# Patient Record
Sex: Male | Born: 1940 | Race: Black or African American | Hispanic: No | Marital: Married | Smoking: Former smoker
Health system: Southern US, Community
[De-identification: ages and names within clinical notes are randomized; demographics above are authoritative.]

## PROBLEM LIST (undated history)

## (undated) DIAGNOSIS — G4733 Obstructive sleep apnea (adult) (pediatric): Secondary | ICD-10-CM

## (undated) DIAGNOSIS — I10 Essential (primary) hypertension: Secondary | ICD-10-CM

## (undated) DIAGNOSIS — D509 Iron deficiency anemia, unspecified: Secondary | ICD-10-CM

## (undated) DIAGNOSIS — J9610 Chronic respiratory failure, unspecified whether with hypoxia or hypercapnia: Secondary | ICD-10-CM

## (undated) DIAGNOSIS — K219 Gastro-esophageal reflux disease without esophagitis: Secondary | ICD-10-CM

## (undated) DIAGNOSIS — E785 Hyperlipidemia, unspecified: Secondary | ICD-10-CM

## (undated) DIAGNOSIS — N183 Chronic kidney disease, stage 3 unspecified: Secondary | ICD-10-CM

## (undated) DIAGNOSIS — I2510 Atherosclerotic heart disease of native coronary artery without angina pectoris: Secondary | ICD-10-CM

## (undated) DIAGNOSIS — J449 Chronic obstructive pulmonary disease, unspecified: Secondary | ICD-10-CM

## (undated) DIAGNOSIS — I509 Heart failure, unspecified: Secondary | ICD-10-CM

## (undated) DIAGNOSIS — E66813 Obesity, class 3: Secondary | ICD-10-CM

## (undated) DIAGNOSIS — M1990 Unspecified osteoarthritis, unspecified site: Secondary | ICD-10-CM

## (undated) DIAGNOSIS — N400 Enlarged prostate without lower urinary tract symptoms: Secondary | ICD-10-CM

## (undated) HISTORY — PX: BACK SURGERY: SHX140

## (undated) HISTORY — PX: CHOLECYSTECTOMY: SHX55

## (undated) HISTORY — PX: TONSILLECTOMY: SUR1361

## (undated) HISTORY — PX: CORONARY ARTERY BYPASS GRAFT: SHX141

## (undated) HISTORY — PX: CATARACT EXTRACTION: SUR2

---

## 2001-01-06 ENCOUNTER — Ambulatory Visit (HOSPITAL_COMMUNITY): Admission: RE | Admit: 2001-01-06 | Discharge: 2001-01-06 | Payer: Self-pay | Admitting: Internal Medicine

## 2001-08-04 ENCOUNTER — Ambulatory Visit (HOSPITAL_COMMUNITY): Admission: RE | Admit: 2001-08-04 | Discharge: 2001-08-04 | Payer: Self-pay | Admitting: Internal Medicine

## 2001-08-17 ENCOUNTER — Encounter: Admission: RE | Admit: 2001-08-17 | Discharge: 2001-08-17 | Payer: Self-pay | Admitting: Internal Medicine

## 2001-08-17 ENCOUNTER — Encounter: Payer: Self-pay | Admitting: Internal Medicine

## 2001-09-14 ENCOUNTER — Encounter: Payer: Self-pay | Admitting: Internal Medicine

## 2001-09-14 ENCOUNTER — Ambulatory Visit (HOSPITAL_COMMUNITY): Admission: RE | Admit: 2001-09-14 | Discharge: 2001-09-14 | Payer: Self-pay | Admitting: Internal Medicine

## 2001-11-24 ENCOUNTER — Inpatient Hospital Stay (HOSPITAL_COMMUNITY): Admission: EM | Admit: 2001-11-24 | Discharge: 2001-12-10 | Payer: Self-pay

## 2001-11-25 ENCOUNTER — Encounter: Payer: Self-pay | Admitting: Interventional Cardiology

## 2001-11-27 ENCOUNTER — Encounter: Payer: Self-pay | Admitting: Interventional Cardiology

## 2001-12-01 ENCOUNTER — Encounter: Payer: Self-pay | Admitting: Thoracic Surgery (Cardiothoracic Vascular Surgery)

## 2001-12-02 ENCOUNTER — Encounter: Payer: Self-pay | Admitting: Thoracic Surgery (Cardiothoracic Vascular Surgery)

## 2001-12-03 ENCOUNTER — Encounter: Payer: Self-pay | Admitting: Thoracic Surgery (Cardiothoracic Vascular Surgery)

## 2001-12-06 ENCOUNTER — Encounter: Payer: Self-pay | Admitting: Cardiothoracic Surgery

## 2001-12-07 ENCOUNTER — Encounter: Payer: Self-pay | Admitting: Thoracic Surgery (Cardiothoracic Vascular Surgery)

## 2002-03-01 ENCOUNTER — Encounter (HOSPITAL_COMMUNITY): Admission: RE | Admit: 2002-03-01 | Discharge: 2002-05-30 | Payer: Self-pay | Admitting: Interventional Cardiology

## 2002-03-21 ENCOUNTER — Encounter: Admission: RE | Admit: 2002-03-21 | Discharge: 2002-03-21 | Payer: Self-pay | Admitting: Interventional Cardiology

## 2002-03-21 ENCOUNTER — Encounter: Payer: Self-pay | Admitting: Interventional Cardiology

## 2002-06-08 ENCOUNTER — Encounter: Admission: RE | Admit: 2002-06-08 | Discharge: 2002-06-08 | Payer: Self-pay | Admitting: Internal Medicine

## 2002-06-08 ENCOUNTER — Encounter: Payer: Self-pay | Admitting: Internal Medicine

## 2002-06-23 ENCOUNTER — Encounter: Admission: RE | Admit: 2002-06-23 | Discharge: 2002-09-21 | Payer: Self-pay | Admitting: Internal Medicine

## 2002-11-04 ENCOUNTER — Ambulatory Visit (HOSPITAL_COMMUNITY): Admission: RE | Admit: 2002-11-04 | Discharge: 2002-11-04 | Payer: Self-pay | Admitting: Gastroenterology

## 2002-12-13 ENCOUNTER — Emergency Department (HOSPITAL_COMMUNITY): Admission: EM | Admit: 2002-12-13 | Discharge: 2002-12-13 | Payer: Self-pay | Admitting: Emergency Medicine

## 2002-12-13 ENCOUNTER — Encounter: Payer: Self-pay | Admitting: Emergency Medicine

## 2003-07-18 ENCOUNTER — Encounter: Admission: RE | Admit: 2003-07-18 | Discharge: 2003-07-18 | Payer: Self-pay | Admitting: Internal Medicine

## 2003-07-18 ENCOUNTER — Encounter: Payer: Self-pay | Admitting: Internal Medicine

## 2004-06-11 ENCOUNTER — Encounter: Admission: RE | Admit: 2004-06-11 | Discharge: 2004-06-11 | Payer: Self-pay | Admitting: Internal Medicine

## 2004-06-17 ENCOUNTER — Inpatient Hospital Stay (HOSPITAL_COMMUNITY): Admission: EM | Admit: 2004-06-17 | Discharge: 2004-06-19 | Payer: Self-pay | Admitting: Internal Medicine

## 2004-11-29 ENCOUNTER — Encounter: Admission: RE | Admit: 2004-11-29 | Discharge: 2004-11-29 | Payer: Self-pay | Admitting: Internal Medicine

## 2004-12-12 ENCOUNTER — Encounter: Admission: RE | Admit: 2004-12-12 | Discharge: 2004-12-12 | Payer: Self-pay | Admitting: Internal Medicine

## 2004-12-31 ENCOUNTER — Encounter: Admission: RE | Admit: 2004-12-31 | Discharge: 2004-12-31 | Payer: Self-pay | Admitting: Internal Medicine

## 2005-01-14 ENCOUNTER — Encounter: Admission: RE | Admit: 2005-01-14 | Discharge: 2005-01-14 | Payer: Self-pay | Admitting: Internal Medicine

## 2005-10-18 ENCOUNTER — Inpatient Hospital Stay (HOSPITAL_COMMUNITY): Admission: EM | Admit: 2005-10-18 | Discharge: 2005-10-20 | Payer: Self-pay | Admitting: *Deleted

## 2006-02-05 ENCOUNTER — Encounter: Admission: RE | Admit: 2006-02-05 | Discharge: 2006-02-05 | Payer: Self-pay | Admitting: Internal Medicine

## 2006-02-13 ENCOUNTER — Inpatient Hospital Stay (HOSPITAL_COMMUNITY): Admission: EM | Admit: 2006-02-13 | Discharge: 2006-02-18 | Payer: Self-pay | Admitting: Emergency Medicine

## 2006-02-16 ENCOUNTER — Encounter (INDEPENDENT_AMBULATORY_CARE_PROVIDER_SITE_OTHER): Payer: Self-pay | Admitting: Specialist

## 2006-10-13 ENCOUNTER — Inpatient Hospital Stay (HOSPITAL_COMMUNITY): Admission: EM | Admit: 2006-10-13 | Discharge: 2006-10-14 | Payer: Self-pay | Admitting: Emergency Medicine

## 2007-01-12 ENCOUNTER — Encounter: Admission: RE | Admit: 2007-01-12 | Discharge: 2007-01-12 | Payer: Self-pay | Admitting: Internal Medicine

## 2007-06-01 ENCOUNTER — Encounter: Admission: RE | Admit: 2007-06-01 | Discharge: 2007-06-01 | Payer: Self-pay | Admitting: Podiatry

## 2007-10-25 ENCOUNTER — Inpatient Hospital Stay (HOSPITAL_COMMUNITY): Admission: EM | Admit: 2007-10-25 | Discharge: 2007-10-27 | Payer: Self-pay | Admitting: Emergency Medicine

## 2007-10-25 ENCOUNTER — Ambulatory Visit: Payer: Self-pay | Admitting: Internal Medicine

## 2007-11-23 ENCOUNTER — Encounter: Admission: RE | Admit: 2007-11-23 | Discharge: 2008-02-21 | Payer: Self-pay | Admitting: Internal Medicine

## 2008-01-31 ENCOUNTER — Emergency Department (HOSPITAL_COMMUNITY): Admission: EM | Admit: 2008-01-31 | Discharge: 2008-01-31 | Payer: Self-pay | Admitting: Emergency Medicine

## 2008-11-10 ENCOUNTER — Encounter: Admission: RE | Admit: 2008-11-10 | Discharge: 2008-11-10 | Payer: Self-pay | Admitting: Internal Medicine

## 2009-05-22 ENCOUNTER — Inpatient Hospital Stay (HOSPITAL_COMMUNITY): Admission: EM | Admit: 2009-05-22 | Discharge: 2009-05-24 | Payer: Self-pay | Admitting: Emergency Medicine

## 2009-05-23 ENCOUNTER — Ambulatory Visit: Payer: Self-pay | Admitting: Physical Medicine & Rehabilitation

## 2009-07-27 ENCOUNTER — Encounter (INDEPENDENT_AMBULATORY_CARE_PROVIDER_SITE_OTHER): Payer: Self-pay | Admitting: Internal Medicine

## 2009-07-27 ENCOUNTER — Ambulatory Visit: Payer: Self-pay | Admitting: Cardiovascular Disease

## 2009-07-27 ENCOUNTER — Observation Stay (HOSPITAL_COMMUNITY): Admission: EM | Admit: 2009-07-27 | Discharge: 2009-07-27 | Payer: Self-pay | Admitting: Emergency Medicine

## 2010-04-22 ENCOUNTER — Inpatient Hospital Stay (HOSPITAL_COMMUNITY): Admission: EM | Admit: 2010-04-22 | Discharge: 2010-04-26 | Payer: Self-pay | Admitting: Emergency Medicine

## 2010-10-20 ENCOUNTER — Encounter: Payer: Self-pay | Admitting: Internal Medicine

## 2010-10-29 ENCOUNTER — Inpatient Hospital Stay (HOSPITAL_COMMUNITY)
Admission: EM | Admit: 2010-10-29 | Discharge: 2010-11-01 | DRG: 641 | Disposition: A | Discharge status: Skilled Nursing Facility | Payer: Medicare Other | Attending: Internal Medicine | Admitting: Internal Medicine

## 2010-10-29 DIAGNOSIS — R627 Adult failure to thrive: Principal | ICD-10-CM | POA: Diagnosis present

## 2010-10-29 DIAGNOSIS — E1139 Type 2 diabetes mellitus with other diabetic ophthalmic complication: Secondary | ICD-10-CM | POA: Diagnosis present

## 2010-10-29 DIAGNOSIS — E1149 Type 2 diabetes mellitus with other diabetic neurological complication: Secondary | ICD-10-CM | POA: Diagnosis present

## 2010-10-29 DIAGNOSIS — E1129 Type 2 diabetes mellitus with other diabetic kidney complication: Secondary | ICD-10-CM | POA: Diagnosis present

## 2010-10-29 DIAGNOSIS — Z9181 History of falling: Secondary | ICD-10-CM

## 2010-10-29 DIAGNOSIS — I739 Peripheral vascular disease, unspecified: Secondary | ICD-10-CM | POA: Diagnosis present

## 2010-10-29 DIAGNOSIS — R269 Unspecified abnormalities of gait and mobility: Secondary | ICD-10-CM | POA: Diagnosis present

## 2010-10-29 DIAGNOSIS — M1990 Unspecified osteoarthritis, unspecified site: Secondary | ICD-10-CM | POA: Diagnosis present

## 2010-10-29 DIAGNOSIS — K219 Gastro-esophageal reflux disease without esophagitis: Secondary | ICD-10-CM | POA: Diagnosis present

## 2010-10-29 DIAGNOSIS — R5381 Other malaise: Secondary | ICD-10-CM | POA: Diagnosis present

## 2010-10-29 DIAGNOSIS — G4733 Obstructive sleep apnea (adult) (pediatric): Secondary | ICD-10-CM | POA: Diagnosis present

## 2010-10-29 DIAGNOSIS — N183 Chronic kidney disease, stage 3 unspecified: Secondary | ICD-10-CM | POA: Diagnosis present

## 2010-10-29 DIAGNOSIS — Z951 Presence of aortocoronary bypass graft: Secondary | ICD-10-CM

## 2010-10-29 DIAGNOSIS — K3184 Gastroparesis: Secondary | ICD-10-CM | POA: Diagnosis present

## 2010-10-29 DIAGNOSIS — K5909 Other constipation: Secondary | ICD-10-CM | POA: Diagnosis present

## 2010-10-29 DIAGNOSIS — G894 Chronic pain syndrome: Secondary | ICD-10-CM | POA: Diagnosis present

## 2010-10-29 DIAGNOSIS — I129 Hypertensive chronic kidney disease with stage 1 through stage 4 chronic kidney disease, or unspecified chronic kidney disease: Secondary | ICD-10-CM | POA: Diagnosis present

## 2010-10-29 DIAGNOSIS — E1142 Type 2 diabetes mellitus with diabetic polyneuropathy: Secondary | ICD-10-CM | POA: Diagnosis present

## 2010-10-29 DIAGNOSIS — E11319 Type 2 diabetes mellitus with unspecified diabetic retinopathy without macular edema: Secondary | ICD-10-CM | POA: Diagnosis present

## 2010-10-29 DIAGNOSIS — I2510 Atherosclerotic heart disease of native coronary artery without angina pectoris: Secondary | ICD-10-CM | POA: Diagnosis present

## 2010-10-29 DIAGNOSIS — Z9981 Dependence on supplemental oxygen: Secondary | ICD-10-CM

## 2010-10-30 ENCOUNTER — Emergency Department (HOSPITAL_COMMUNITY): Payer: Medicare Other

## 2010-10-30 LAB — HEPATIC FUNCTION PANEL
Alkaline Phosphatase: 72 U/L (ref 39–117)
Bilirubin, Direct: 0.4 mg/dL — ABNORMAL HIGH (ref 0.0–0.3)
Indirect Bilirubin: 0.5 mg/dL (ref 0.3–0.9)
Total Bilirubin: 0.9 mg/dL (ref 0.3–1.2)
Total Protein: 6.6 g/dL (ref 6.0–8.3)

## 2010-10-30 LAB — GLUCOSE, CAPILLARY
Glucose-Capillary: 171 mg/dL — ABNORMAL HIGH (ref 70–99)
Glucose-Capillary: 144 mg/dL — ABNORMAL HIGH (ref 70–99)

## 2010-10-30 LAB — CBC
HCT: 37.3 % — ABNORMAL LOW (ref 39.0–52.0)
Hemoglobin: 11.8 g/dL — ABNORMAL LOW (ref 13.0–17.0)
MCH: 23 pg — ABNORMAL LOW (ref 26.0–34.0)
MCV: 72.7 fL — ABNORMAL LOW (ref 78.0–100.0)
Platelets: 168 10*3/uL (ref 150–400)
RDW: 17.3 % — ABNORMAL HIGH (ref 11.5–15.5)
WBC: 6.8 10*3/uL (ref 4.0–10.5)

## 2010-10-30 LAB — CARDIAC PANEL(CRET KIN+CKTOT+MB+TROPI)
CK, MB: 4.9 ng/mL — ABNORMAL HIGH (ref 0.3–4.0)
Relative Index: 2.1 (ref 0.0–2.5)
Troponin I: 0.02 ng/mL (ref 0.00–0.06)

## 2010-10-30 LAB — TSH: TSH: 1.419 u[IU]/mL (ref 0.350–4.500)

## 2010-10-30 LAB — DIFFERENTIAL
Basophils Absolute: 0 10*3/uL (ref 0.0–0.1)
Eosinophils Absolute: 0.1 10*3/uL (ref 0.0–0.7)
Eosinophils Relative: 1 % (ref 0–5)
Lymphocytes Relative: 23 % (ref 12–46)
Monocytes Absolute: 0.7 10*3/uL (ref 0.1–1.0)
Monocytes Relative: 11 % (ref 3–12)

## 2010-10-31 LAB — CBC
HCT: 35.2 % — ABNORMAL LOW (ref 39.0–52.0)
MCH: 23 pg — ABNORMAL LOW (ref 26.0–34.0)
MCV: 72.4 fL — ABNORMAL LOW (ref 78.0–100.0)
RBC: 4.86 MIL/uL (ref 4.22–5.81)

## 2010-10-31 LAB — GLUCOSE, CAPILLARY
Glucose-Capillary: 125 mg/dL — ABNORMAL HIGH (ref 70–99)
Glucose-Capillary: 140 mg/dL — ABNORMAL HIGH (ref 70–99)

## 2010-10-31 LAB — COMPREHENSIVE METABOLIC PANEL
ALT: 14 U/L (ref 0–53)
Albumin: 2.7 g/dL — ABNORMAL LOW (ref 3.5–5.2)
Chloride: 106 mEq/L (ref 96–112)
GFR calc Af Amer: 42 mL/min — ABNORMAL LOW (ref >60)
GFR calc non Af Amer: 34 mL/min — ABNORMAL LOW (ref >60)
Glucose, Bld: 120 mg/dL — ABNORMAL HIGH (ref 70–99)
Sodium: 139 mEq/L (ref 135–145)
Total Protein: 6.1 g/dL (ref 6.0–8.3)

## 2010-11-01 LAB — GLUCOSE, CAPILLARY: Glucose-Capillary: 98 mg/dL (ref 70–99)

## 2010-11-03 NOTE — Discharge Summary (Signed)
[name], HICKOX NO.:  [id number]  MEDICAL RECORD NO.:  [id number]           PATIENT TYPE:  I  LOCATION:  1511                         FACILITY:  [facility]  PHYSICIAN:  Georgann Housekeeper, MD      DATE OF BIRTH:  Mar 31, 1941  DATE OF ADMISSION:  10/29/2010 DATE OF DISCHARGE:  11/01/2010                              DISCHARGE SUMMARY   ALLERGIES:  To DURAGESIC PATCH.  MEDICATIONS ON DISCHARGE:  Benazepril 20 mg p.o. b.i.d., gabapentin 600 mg 1 capsule 4 times a day, Amaryl 2 mg 1 tablet at noontime, Humalog sliding scale per hospital with meals, isosorbide mononitrate 60 mg p.o. daily, omeprazole 40 mg one daily, oxycodone/acetaminophen 10/325 one tablet q.6-8 h. p.r.n. for pain, Flomax 0.4 mg 1 capsule p.m., simvastatin 20 mg 1 capsule at bedtime, temazepam 30 mg q.h.s., Lantus 50 units b.i.d., MiraLax 1 packet p.o. daily, Colace 100 mg b.i.d., Senokot 1 tablet q.h.s., Metanx 1 tablet b.i.d. on the condom catheter, oxygen 2 L nasal cannula on the benazepril parameters hold for blood pressure less than 110,  Reglan 5 mg q.a.c.  As far as his as discharge diagnoses; 1. Failure to thrive. 2. Deconditioning. 3. Gait disorder. 4. Frequent falls. 5. Chronic neuropathy. 6. Chronic back pain and chronic pain syndrome. 7. Constipation, chronic, resolved. 8. Gastroparesis. 9. 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 10.Hypertension. 11.History of coronary artery disease, coronary artery bypass graft. 12.Peripheral vascular disease. 13.Chronic kidney disease stage III. 14.Severe osteoarthritis. 15.Gastroesophageal reflux disease. 16.Obstructive sleep apnea, chronic oxygen use.  As far as his laboratory data; his UA was negative.  CBC showed hemoglobin of 11.2, white count of 7.7.  Creatinine of 1.9, potassium 4.6, sodium 139.  LFTs were normal.  Cardiac markers negative.  Chest x- ray and abdominal x-ray negative.  HOSPITAL COURSE:  A   70 year old male admitted with failure to thrive, deconditioning, unable to be taken care at home, constipation, abdominal discomfort with chronic pain. 1. Failure to thrive and deconditioning with gait disorder.  The     patient has physical therapy consult, recommended SNF placement.     The patient because of his weight and morbid obesity, unable to be     cared by his wife who was elderly and has stroke. The patient had     been using the walker but even with that, he was been falling and     unable to get up on his own, chronic pain syndrome. 2. Constipation.  The patient was treated with enemas and his bowel     regimen was adjusted.  He had good bowel movements.  Continue on     MiraLax, Colace, and Senokot. 3. Chronic pain syndrome.  He has been on oxycodone at home.  Will try     to get on Duragesic patch but he had in the past intolerant, even     retrying of the patch at lower dose caused him vomiting which was     discontinued. As far as his history of diabetes with multiple     complications and gastroparesis, continue on Reglan.  4. Hypertension and coronary artery disease, stable chronic kidney     disease stage III, creatinine 1.9, stable. 5. Gastroesophageal reflux disease.  Continue on PPI.  Chronic     neuropathy, on Neurontin. Metanx was added for the neuropathic     pain.  The patient will be going to the SNF rehab care today,     medically stable to go.  Discharge time taken greater than 30 minutes.     Georgann Housekeeper, MD     KH/MEDQ  D:  11/01/2010  T:  11/01/2010  Job:  [id number]  Electronically Signed by Georgann Housekeeper MD on 11/03/2010 10:29:11 PM

## 2010-11-20 NOTE — H&P (Signed)
Donald [name], [name] NO.:  [id number]  MEDICAL RECORD NO.:  [id number]          PATIENT TYPE:  EMS  LOCATION:  ED                           FACILITY:  [facility]  PHYSICIAN:  Massie Maroon, MD        DATE OF BIRTH:  03-04-1941  DATE OF ADMISSION:  10/29/2010 DATE OF DISCHARGE:                             HISTORY & PHYSICAL   CHIEF COMPLAINT:  " I am constipated and I am having failure to thrive."  HISTORY OF PRESENT ILLNESS:  The patient is a 70 year old male with a history of CAD status post CABG, diabetes, diabetic gastroparesis, diabetic neuropathy, PVD, apparently complains of generalized myalgia, back pain, arm pain, shoulder pain.  His wife states that she cannot take care of him at home.  Essentially, the wife wants nursing home placement for her husband as she can no longer take care of him.  PAST MEDICAL HISTORY: 1. Hypertension. 2. Hyperlipidemia. 3. CAD status post CABG. 4. Diabetes type 2. 5. Diabetic gastroparesis. 6. Diabetic neuropathy. 7. PVD. 8. Chronic pain syndrome from multiple back surgeries. 9. Chronic kidney disease, stage 3. 10.GERD. 11.Osteoarthritis. 12.History of constipation. 13.Obstructive sleep apnea, on 3 L nasal cannula at night. 14.CT scan of the abdomen and pelvis on Feb 12, 2006 showed poor     definition of fat plane surrounding the head and uncinate process     of pancreas raising concern for focal pancreatitis or even a     pancreatic mass.  Consider MR imaging.  SOCIAL HISTORY:  The patient is living at home.  Married.  Six children. The patient does not drink.  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  FAMILY HISTORY:  Mother died of complications of diabetes.  Father died of his 92s of old age.  ALLERGIES:  No known drug allergies.  MEDICATIONS:  Isosorbide mononitrate 60 mg p.o. daily, benazepril 20 mg p.o. b.i.d., Zocor 20 mg p.o. q.h.s.,  Flomax 0.4 mg p.o. q.h.s., Amaryl 2 mg p.o. daily, temazepam 30 mg p.o. daily, omeprazole 20 mg p.o. daily, gabapentin 600 mg p.o. t.i.d.  REVIEW OF SYSTEMS:  Negative for all 10 organ systems except for pertinent positives stated above.  PHYSICAL EXAM:  VITAL SIGNS:  Temperature afebrile, pulse 85, blood pressure 124/64, pulse ox of 90% on room air and 96% on 3 L nasasl cannula. HEENT:  HEENT: Anicteric.  Small oropharynx. NECK:  No JVD, no bruit. HEART:  Regular rate and rhythm.  S1, S2.  No murmurs, gallops or rubs. LUNGS:  Clear to auscultation bilaterally. ABDOMEN:  Soft, morbidly obese, nontender, nondistended.  Positive bowel sounds. EXTREMITIES: No cyanosis, clubbing or edema.  Skin: No rash in the lymph nodes no adenopathy. SKIN:  No rashes. LYMPH NODES:  No adenopathy. NEURO EXAM:  Nonfocal.  RADIOLOGY AND LABORATORY DATA:  Urinalysis, RBC's 3 to 6.  Sodium 138, potassium 4.8, BUN 26, creatinine 1.9.  CBC is not back or not done.  Abdominal x-ray shows mild to moderate CHF, postoperative changes of CABG, and  normal bowel gas pattern.  The patient does not have clinical evidence of CHF.  No EKG is available.  ASSESSMENT/PLAN: 1. Failure to thrive:  Social work consult.  The patient needs nursing     home placement. 2. Constipation:  Colace 100 mg p.o. b.i.d.  MiraLax 17 g p.o. b.i.d. 3. Coronary artery disease:  Continue Imdur.  Continue benazepril. 4. Gastroesophageal reflux disease:  Continue omeprazole. 5. Chronic pain:  Continue gabapentin. 6. Diabetic neuropathy:  Continue gabapentin.  Consider adding Metanx     1 p.o. b.i.d. 7. Diabetes type 2:  Continue Amaryl, fingerstick blood sugars a.c.     and h.s., NovoLog sensitive sliding scale 8. Deep vein thrombosis prophylaxis.  SCDs.     Massie Maroon, MD     JYK/MEDQ  D:  10/30/2010  T:  10/30/2010  Job:  [id number]  Electronically Signed by Pearson Grippe MD on 11/20/2010 01:21:27 PM

## 2010-12-02 ENCOUNTER — Other Ambulatory Visit: Payer: Self-pay | Admitting: Internal Medicine

## 2010-12-02 DIAGNOSIS — R1011 Right upper quadrant pain: Secondary | ICD-10-CM

## 2010-12-03 ENCOUNTER — Other Ambulatory Visit: Payer: Self-pay | Admitting: Internal Medicine

## 2010-12-03 ENCOUNTER — Ambulatory Visit
Admission: RE | Admit: 2010-12-03 | Discharge: 2010-12-03 | Disposition: A | Discharge status: Home / Self Care | Payer: Medicare Other | Source: Ambulatory Visit | Attending: Internal Medicine | Admitting: Internal Medicine

## 2010-12-03 DIAGNOSIS — R1011 Right upper quadrant pain: Secondary | ICD-10-CM

## 2010-12-14 LAB — CBC
HCT: 38.9 % — ABNORMAL LOW (ref 39.0–52.0)
Hemoglobin: 12.5 g/dL — ABNORMAL LOW (ref 13.0–17.0)
Platelets: 193 10*3/uL (ref 150–400)
RBC: 4.85 MIL/uL (ref 4.22–5.81)
RBC: 5.28 MIL/uL (ref 4.22–5.81)
RDW: 18.6 % — ABNORMAL HIGH (ref 11.5–15.5)
WBC: 6.3 10*3/uL (ref 4.0–10.5)
WBC: 7 10*3/uL (ref 4.0–10.5)

## 2010-12-14 LAB — BASIC METABOLIC PANEL
BUN: 40 mg/dL — ABNORMAL HIGH (ref 6–23)
BUN: 45 mg/dL — ABNORMAL HIGH (ref 6–23)
CO2: 22 mEq/L (ref 19–32)
CO2: 23 mEq/L (ref 19–32)
Calcium: 8.1 mg/dL — ABNORMAL LOW (ref 8.4–10.5)
Calcium: 8.3 mg/dL — ABNORMAL LOW (ref 8.4–10.5)
Calcium: 8.7 mg/dL (ref 8.4–10.5)
Chloride: 104 mEq/L (ref 96–112)
Chloride: 108 mEq/L (ref 96–112)
Creatinine, Ser: 3.32 mg/dL — ABNORMAL HIGH (ref 0.4–1.5)
Creatinine, Ser: 3.48 mg/dL — ABNORMAL HIGH (ref 0.4–1.5)
GFR calc Af Amer: 21 mL/min — ABNORMAL LOW (ref >60)
GFR calc Af Amer: 29 mL/min — ABNORMAL LOW (ref >60)
GFR calc Af Amer: 47 mL/min — ABNORMAL LOW (ref >60)
GFR calc non Af Amer: 19 mL/min — ABNORMAL LOW (ref >60)
GFR calc non Af Amer: 24 mL/min — ABNORMAL LOW (ref >60)
Glucose, Bld: 147 mg/dL — ABNORMAL HIGH (ref 70–99)
Glucose, Bld: 152 mg/dL — ABNORMAL HIGH (ref 70–99)
Potassium: 5.1 mEq/L (ref 3.5–5.1)
Sodium: 133 mEq/L — ABNORMAL LOW (ref 135–145)
Sodium: 134 mEq/L — ABNORMAL LOW (ref 135–145)

## 2010-12-14 LAB — POCT I-STAT, CHEM 8
BUN: 41 mg/dL — ABNORMAL HIGH (ref 6–23)
Calcium, Ion: 1.12 mmol/L (ref 1.12–1.32)
Hemoglobin: 14.3 g/dL (ref 13.0–17.0)
Sodium: 133 mEq/L — ABNORMAL LOW (ref 135–145)
TCO2: 22 mmol/L (ref 0–100)

## 2010-12-14 LAB — DIFFERENTIAL
Basophils Relative: 0 % (ref 0–1)
Lymphocytes Relative: 39 % (ref 12–46)
Lymphs Abs: 2.7 10*3/uL (ref 0.7–4.0)
Monocytes Absolute: 0.7 10*3/uL (ref 0.1–1.0)
Monocytes Relative: 10 % (ref 3–12)
Neutro Abs: 3.4 10*3/uL (ref 1.7–7.7)
Neutrophils Relative %: 49 % (ref 43–77)

## 2010-12-14 LAB — COMPREHENSIVE METABOLIC PANEL
CO2: 20 mEq/L (ref 19–32)
Calcium: 9.1 mg/dL (ref 8.4–10.5)
Creatinine, Ser: 3.19 mg/dL — ABNORMAL HIGH (ref 0.4–1.5)
GFR calc Af Amer: 23 mL/min — ABNORMAL LOW (ref >60)
GFR calc non Af Amer: 19 mL/min — ABNORMAL LOW (ref >60)
Glucose, Bld: 136 mg/dL — ABNORMAL HIGH (ref 70–99)
Total Protein: 7.3 g/dL (ref 6.0–8.3)

## 2010-12-14 LAB — URINALYSIS, ROUTINE W REFLEX MICROSCOPIC
Bilirubin Urine: NEGATIVE
Glucose, UA: NEGATIVE mg/dL
Hgb urine dipstick: NEGATIVE
Specific Gravity, Urine: 1.014 (ref 1.005–1.030)
Urobilinogen, UA: 0.2 mg/dL (ref 0.0–1.0)
pH: 5 (ref 5.0–8.0)

## 2010-12-14 LAB — URINE CULTURE: Colony Count: NO GROWTH

## 2010-12-14 LAB — GLUCOSE, CAPILLARY
Glucose-Capillary: 124 mg/dL — ABNORMAL HIGH (ref 70–99)
Glucose-Capillary: 124 mg/dL — ABNORMAL HIGH (ref 70–99)
Glucose-Capillary: 129 mg/dL — ABNORMAL HIGH (ref 70–99)
Glucose-Capillary: 175 mg/dL — ABNORMAL HIGH (ref 70–99)
Glucose-Capillary: 194 mg/dL — ABNORMAL HIGH (ref 70–99)
Glucose-Capillary: 246 mg/dL — ABNORMAL HIGH (ref 70–99)
Glucose-Capillary: 250 mg/dL — ABNORMAL HIGH (ref 70–99)

## 2010-12-14 LAB — PSA: PSA: 0.27 ng/mL (ref 0.10–4.00)

## 2011-01-02 LAB — BASIC METABOLIC PANEL
BUN: 38 mg/dL — ABNORMAL HIGH (ref 6–23)
CO2: 25 mEq/L (ref 19–32)
CO2: 26 mEq/L (ref 19–32)
Calcium: 8.8 mg/dL (ref 8.4–10.5)
Chloride: 105 mEq/L (ref 96–112)
Chloride: 105 mEq/L (ref 96–112)
Creatinine, Ser: 1.95 mg/dL — ABNORMAL HIGH (ref 0.4–1.5)
GFR calc Af Amer: 43 mL/min — ABNORMAL LOW (ref >60)
Sodium: 138 mEq/L (ref 135–145)

## 2011-01-02 LAB — CBC
HCT: 32.7 % — ABNORMAL LOW (ref 39.0–52.0)
Hemoglobin: 10.6 g/dL — ABNORMAL LOW (ref 13.0–17.0)
MCHC: 32.3 g/dL (ref 30.0–36.0)
MCV: 73.8 fL — ABNORMAL LOW (ref 78.0–100.0)
Platelets: 194 10*3/uL (ref 150–400)
RBC: 4.4 MIL/uL (ref 4.22–5.81)
RDW: 18.4 % — ABNORMAL HIGH (ref 11.5–15.5)
WBC: 13 10*3/uL — ABNORMAL HIGH (ref 4.0–10.5)

## 2011-01-02 LAB — GLUCOSE, CAPILLARY: Glucose-Capillary: 84 mg/dL (ref 70–99)

## 2011-01-02 LAB — CARDIAC PANEL(CRET KIN+CKTOT+MB+TROPI)
CK, MB: 3 ng/mL (ref 0.3–4.0)
Total CK: 247 U/L — ABNORMAL HIGH (ref 7–232)
Troponin I: 0.01 ng/mL (ref 0.00–0.06)

## 2011-01-02 LAB — DIFFERENTIAL
Basophils Absolute: 0.1 10*3/uL (ref 0.0–0.1)
Eosinophils Absolute: 0.4 10*3/uL (ref 0.0–0.7)
Monocytes Absolute: 1 10*3/uL (ref 0.1–1.0)
Neutrophils Relative %: 45 % (ref 43–77)

## 2011-01-02 LAB — POCT CARDIAC MARKERS
Myoglobin, poc: >500 ng/mL (ref 12–200)
Troponin i, poc: 0.05 ng/mL (ref 0.00–0.09)
Troponin i, poc: <0.05 ng/mL (ref 0.00–0.09)

## 2011-01-02 LAB — URINALYSIS, ROUTINE W REFLEX MICROSCOPIC
Ketones, ur: NEGATIVE mg/dL
Nitrite: NEGATIVE
Protein, ur: NEGATIVE mg/dL

## 2011-01-02 LAB — PROTIME-INR

## 2011-01-04 LAB — DIFFERENTIAL
Basophils Absolute: 0 10*3/uL (ref 0.0–0.1)
Basophils Relative: 1 % (ref 0–1)
Lymphocytes Relative: 17 % (ref 12–46)
Monocytes Absolute: 0.7 10*3/uL (ref 0.1–1.0)
Monocytes Relative: 8 % (ref 3–12)
Neutro Abs: 6.3 10*3/uL (ref 1.7–7.7)
Neutrophils Relative %: 74 % (ref 43–77)

## 2011-01-04 LAB — BASIC METABOLIC PANEL
BUN: 19 mg/dL (ref 6–23)
BUN: 21 mg/dL (ref 6–23)
BUN: 22 mg/dL (ref 6–23)
CO2: 26 mEq/L (ref 19–32)
CO2: 27 mEq/L (ref 19–32)
Chloride: 108 mEq/L (ref 96–112)
GFR calc Af Amer: 51 mL/min — ABNORMAL LOW (ref >60)
GFR calc non Af Amer: 42 mL/min — ABNORMAL LOW (ref >60)
Glucose, Bld: 179 mg/dL — ABNORMAL HIGH (ref 70–99)
Potassium: 5 mEq/L (ref 3.5–5.1)
Potassium: 5 mEq/L (ref 3.5–5.1)
Potassium: 5.2 mEq/L — ABNORMAL HIGH (ref 3.5–5.1)
Sodium: 137 mEq/L (ref 135–145)
Sodium: 141 mEq/L (ref 135–145)

## 2011-01-04 LAB — GLUCOSE, CAPILLARY
Glucose-Capillary: 111 mg/dL — ABNORMAL HIGH (ref 70–99)
Glucose-Capillary: 133 mg/dL — ABNORMAL HIGH (ref 70–99)
Glucose-Capillary: 134 mg/dL — ABNORMAL HIGH (ref 70–99)
Glucose-Capillary: 140 mg/dL — ABNORMAL HIGH (ref 70–99)
Glucose-Capillary: 141 mg/dL — ABNORMAL HIGH (ref 70–99)
Glucose-Capillary: 151 mg/dL — ABNORMAL HIGH (ref 70–99)
Glucose-Capillary: 151 mg/dL — ABNORMAL HIGH (ref 70–99)
Glucose-Capillary: 179 mg/dL — ABNORMAL HIGH (ref 70–99)
Glucose-Capillary: 186 mg/dL — ABNORMAL HIGH (ref 70–99)
Glucose-Capillary: 288 mg/dL — ABNORMAL HIGH (ref 70–99)
Glucose-Capillary: 66 mg/dL — ABNORMAL LOW (ref 70–99)
Glucose-Capillary: 66 mg/dL — ABNORMAL LOW (ref 70–99)
Glucose-Capillary: 68 mg/dL — ABNORMAL LOW (ref 70–99)
Glucose-Capillary: 75 mg/dL (ref 70–99)

## 2011-01-04 LAB — URINALYSIS, ROUTINE W REFLEX MICROSCOPIC
Glucose, UA: NEGATIVE mg/dL
Hgb urine dipstick: NEGATIVE
Protein, ur: NEGATIVE mg/dL
Specific Gravity, Urine: 1.01 (ref 1.005–1.030)
pH: 6.5 (ref 5.0–8.0)

## 2011-01-04 LAB — CBC
HCT: 31.4 % — ABNORMAL LOW (ref 39.0–52.0)
Hemoglobin: 10.2 g/dL — ABNORMAL LOW (ref 13.0–17.0)
MCHC: 31.8 g/dL (ref 30.0–36.0)
MCHC: 32.4 g/dL (ref 30.0–36.0)
MCV: 74.4 fL — ABNORMAL LOW (ref 78.0–100.0)
Platelets: 205 10*3/uL (ref 150–400)
Platelets: 215 10*3/uL (ref 150–400)
RDW: 18 % — ABNORMAL HIGH (ref 11.5–15.5)
RDW: 19 % — ABNORMAL HIGH (ref 11.5–15.5)

## 2011-01-04 LAB — POCT CARDIAC MARKERS
CKMB, poc: 1.6 ng/mL (ref 1.0–8.0)
Myoglobin, poc: 175 ng/mL (ref 12–200)
Troponin i, poc: <0.05 ng/mL (ref 0.00–0.09)

## 2011-01-04 LAB — CK TOTAL AND CKMB (NOT AT ARMC)
CK, MB: 4.4 ng/mL — ABNORMAL HIGH (ref 0.3–4.0)
Relative Index: 1.3 (ref 0.0–2.5)
Total CK: 326 U/L — ABNORMAL HIGH (ref 7–232)

## 2011-01-04 LAB — COMPREHENSIVE METABOLIC PANEL
Albumin: 3.2 g/dL — ABNORMAL LOW (ref 3.5–5.2)
Alkaline Phosphatase: 74 U/L (ref 39–117)
BUN: 22 mg/dL (ref 6–23)
Creatinine, Ser: 1.92 mg/dL — ABNORMAL HIGH (ref 0.4–1.5)
Glucose, Bld: 98 mg/dL (ref 70–99)
Potassium: 6.5 mEq/L (ref 3.5–5.1)
Total Protein: 7.2 g/dL (ref 6.0–8.3)

## 2011-02-11 NOTE — Discharge Summary (Signed)
NAMERANSOM, NICKSON NO.:  [id number]   MEDICAL RECORD NO.:  [id number]          PATIENT TYPE:  INP   LOCATION:  1430                         FACILITY:  [facility]   PHYSICIAN:  Georgann Housekeeper, MD      DATE OF BIRTH:  10/27/1940   DATE OF ADMISSION:  05/20/2009  DATE OF DISCHARGE:  05/24/2009                               DISCHARGE SUMMARY   DISCHARGE DIAGNOSES:  1. Weakness, deconditioning.  2. Failure to thrive.  3. Episode of syncope related to hypoglycemia resolved.  4. Hyperkalemia, resolved.  5. Chronic renal insufficiency.  6. Type 2 diabetes, insulin dependent.  7. Severe peripheral neuropathy.  8. Chronic back pain with multiple back surgeries.  9. Gastroesophageal reflux disease.  10.History of coronary artery disease, status post coronary artery      bypass graft surgery in the past.  11.Morbid obesity.  12.Osteoarthritis.  13.History of gastroparesis.  14.Chronic constipation.   MEDICATION ON DISCHARGE:  1. Simvastatin 20 mg daily.  2. Isosorbide 60 mg daily.  3. Pletal 50 mg b.i.d.  4. Neurontin 600 mg q.i.d.  5. Protonix 40 mg daily.  6. Insulin NovoLog sliding scale with meals with the CBG 121-150 one      unit, 151-200 two units, 201-250 three units, 251-300 five units,      301-350 seven units, 351-400 nine units and greater than 400,  call      MD.  7. Insulin Lantus 50 units b.i.d.  8. Lotensin 20 mg b.i.d.  9. Reglan 0 mg p.o. q.a.c. and h.s. to be given 15 minutes before      meals.  10.MiraLax 17 grams daily.  11.Senokot-S 2 tablets at night.  12.Lidoderm 5% pain patch p.r.n. for 12 hours and remove.  13.Percocet 5/325 mg 1 q.6 hours p.r.n. for pain.  14.Metoprolol 25 mg b.i.d.  15.Furosemide 40 mg daily.  16.Flexeril 10 mg q.h.s. p.r.n.   Blood pressure between 138-160, temperature 97.5, pulse 80s, saturations  97% on 2 liters.   LABORATORY DATA:  Electrolytes:  Potassium 5.0, creatinine 1.5, glucose  111, sodium 141.  White  count of 6.2, hemoglobin 10.2.  LFTs were  normal.  Cardiac markers negative.  Hemoglobin A1c 8.2.  CT of the head  negative.  Chest x-ray negative.  Urinalysis negative.   HOSPITAL COURSE:  A 70 year old with multiple medical problems admitted  after multiple falls at home and weakness and episode of syncope and was  brought to the emergency room.  1. He was found to have hypoglycemia, his blood sugar was in the low      50s and the EMS gave him D50.  His sugar improved to the emergency      room.  The patient has fluctuating diabetes on insulin and sliding      scale.  His Amaryl was discontinued.  He continues to do well on      his sugars remaining in the 100s to 150.  He will continue on      NovoLog sliding scale as well as Lantus insulin.  2. Episode of hyperkalemia.  Potassium was 6.0.  He was given some      Kayexalate and insulin in the emergency room.  His potassium      improved, at time of discharge was 5.0 and he hemodynamically      remained stable.  No EKG changes.  3. Weakness and falls.  The patient has chronic neuropathy with      chronic leg pains and severe back problems with spinal stenosis and      multiple back surgeries.  He had some right leg weakness, right leg      pain more so than the left leg, which was chronic.  Physical      therapy was consulted and encouraged him to do the therapy and      rehab.  Continue with pain management with Lidoderm pain patch as      well as Percocet and Neurontin.  He continues to do better in the      hospital, though still requiring assistance with the walker and he      will benefit from physical therapy.  4. Hypertension.  Continue on blood pressure medication.  His      amlodipine was discontinued.  Continue on benazepril as well as his      Lasix and metoprolol.  5. Acute on chronic renal insufficiency.  Creatinine 1.9 on admission.      His Lasix was held.  He was given some IV fluids, creatinine      baseline 1.6, at  time of discharge was 1.5.  He will restart his      Lasix 40 mg daily.  6. Gastroparesis and GERD.  Continue on Reglan before meals as well as      Protonix.  7. Peripheral vascular disease.  He is taking Pletal for that,      continue on that.   FOLLOW-UP:  After he comes home from rehab, will follow up with primary  care physician.   CONDITION ON DISCHARGE:  Improved.   CONSULTATION:  Physical therapy.   The patient will be going to the rehab facility today.      Georgann Housekeeper, MD  Electronically Signed     KH/MEDQ  D:  05/24/2009  T:  05/24/2009  Job:  [id number]

## 2011-02-11 NOTE — H&P (Signed)
[name], HURLBUT NO.:  [id number]   MEDICAL RECORD NO.:  [id number]          PATIENT TYPE:  EMS   LOCATION:  ED                           FACILITY:  [facility]   PHYSICIAN:  Hollice Espy, M.D.DATE OF BIRTH:  January 06, 1941   DATE OF ADMISSION:  05/20/2009  DATE OF DISCHARGE:                              HISTORY & PHYSICAL   PRIMARY CARE PHYSICIAN:  Georgann Housekeeper, MD.   CHIEF COMPLAINT:  Syncope.   HISTORY OF PRESENT ILLNESS:  The patient is a 70 year old African  American male, past medical history of obesity, diabetes mellitus with  secondary peripheral neuropathy and hypertension, who said he was not  feeling very well for the last few days, but then today he called to his  wife and felt very lightheaded.  His wife had come in and found him on  the ground.  He was not shaking, but he initially was briefly  unresponsive for a second, and he was noted to be incontinent of stool  and urine and looked to be initially a little bit confused from this in  that he did not realize that this had happened.  He was brought into the  emergency room by paramedics.  They noted en route that the patient had  hypoglycemia with CBGs in the 50s and 60s, a normal white count and no  shift and a normal hemoglobin, but a minimally-elevated BUN at 22 and a  creatinine of 1.9.  The patient was given some ampules of D50 and his  blood sugar minimally improved up into the high 60s.  It was noted,  however, that potassium was also elevated at 6.5.  This level was  repeated and confirmed.  Following this, the patient was then given some  more ampules of D50 as well as 5 units of NovoLog insulin, Kayexalate  and albuterol to improve his potassium level.  He had no signs of any  peaked T-waves on his EKG.  When I saw the patient, he was alert and  oriented.  He had no complaints.  He denied any headaches, vision  changes, dysphagia, chest pain, palpitations, shortness of breath,  wheeze, cough, abdominal pain, hematuria, dysuria, constipation or  diarrhea.  His only complaint more was of bilateral lower extremity  pain, which he says his legs always hurt him because of his diabetes.   REVIEW OF SYSTEMS:  Otherwise negative.   PAST MEDICAL HISTORY:  1. Hypertension.  2. Hyperlipidemia.  3. Diabetes mellitus.  4. Secondary peripheral neuropathy.  5. GERD.  6. He also has a previous history of CAD, status post CABG.   MEDICATIONS:  The patient is on:  1. Zocor 20.  2. Imdur 60.  3. Glyburide 2 b.i.d.  4. Cilostazol 50 b.i.d.  5. Neurontin 600 four times a day.  6. Nexium 40.  7. Benazepril 20 b.i.d.   He has no known drug allergies.   SOCIAL HISTORY:  No tobacco, alcohol or drug use.   FAMILY HISTORY:  Noncontributory.   PHYSICAL EXAMINATION:  VITALS ON ADMISSION:  Temperature 97.8, heart  rate 77,  blood pressure 122/56, respirations 22, O2 saturation 97% on 2  liters.  GENERAL:  He is currently alert and oriented x3, in no apparent  distress.  HEENT:  Normocephalic atraumatic.  His mucous membranes are slightly  dry.  He has no carotid bruits.  HEART:  A regular rate and rhythm, S1, S2.  LUNGS:  Decreased breath sounds throughout secondary to body habitus.  ABDOMEN:  Soft, obese, nontender, positive bowel sounds.  EXTREMITIES:  Show no clubbing, cyanosis, but a 1+ pitting edema  bilaterally.  Poor peripheral pulses.  He has signs consistent with  peripheral neuropathy, including decreased sensation and hair loss in  his lower extremities.   LAB WORK:  UA is negative.  Glucose initially started at 66.  After  several ampules of D50 it has stayed at 68.  CPK 175, MB 1.6, troponin I  less than 0.05.  White count 8.6, H and H 11 and 36, MCV of 75, platelet  count 215, no shift.  Sodium 140, potassium 6.5, chloride 107, bicarb  25, BUN 22, creatinine 1.9, glucose 98.  CT scan of the head is negative  for bleed or acute intracranial process, just some  stable atrophy.  He  has some mild pulmonary vascular congestion.   ASSESSMENT/PLAN:  1. Syncope.  The patient may have had a questionable seizure versus      straightforward syncope secondary to suspect #2.  2. Hypoglycemia.  3. Acute renal failure, which is mild and may be causing #4.  4. Hyperkalemia.  I suspect that maybe perhaps the patient had      decreased clearance of his glipizide from dehydration, which led to      prolonged hypoglycemia and syncope versus seizure.  Plan to observe      the patient overnight, IV fluids gently.  Place on telemetry.  Add      D50.  Hold glipizide and follow CBGs.      Hollice Espy, M.D.  Electronically Signed     SKK/MEDQ  D:  05/20/2009  T:  05/20/2009  Job:  [id number]   cc:   Georgann Housekeeper, MD  Fax: [phone]

## 2011-02-11 NOTE — Discharge Summary (Signed)
NAMEMASSIMILIANO, Hickman NO.:  [id number]   MEDICAL RECORD NO.:  [id number]          PATIENT TYPE:  INP   LOCATION:  6527                         FACILITY:  [facility]   PHYSICIAN:  Georgann Housekeeper, MD      DATE OF BIRTH:  Mar 04, 1941   DATE OF ADMISSION:  10/24/2007  DATE OF DISCHARGE:  10/27/2007                               DISCHARGE SUMMARY   DISCHARGE DIAGNOSES:  1. Weakness.  2. Dizziness.  3. Failure to thrive.  4. Dehydration.  5. History of chronic constipation.  6. Chronic pain medication use.  7. Hypertension.  8. Diabetes.  9. Coronary artery disease.  10.Chronic neuropathy.  11.Chronic back pain.   MEDICATIONS AT DISCHARGE:  1. Zocor 20 mg at night.  2. Lotensin 20 mg daily.  3. Neurontin 600 mg 4 times a day.  4. Pletal 50 mg daily.  5. Amlodipine 5 mg daily.  6. Reglan 5 mg q.a.c. and h.s. 30 minutes before each meal.  7. Lasix 40 mg p.o. daily.  8. Colace 100 mg p.o. b.i.d.  9. Lantus insulin 40 units subcutaneous b.i.d.  10.Amaryl 2 mg at noon with meals.  11.Nexium 40 mg b.i.d.; do not substitute.  12.Senokot 2 tablets p.o. b.i.d.  13.MiraLax 17 g p.o. b.i.d.  14.Dulcolax suppositories 2 p.r.n.  15.Lidoderm pain patch 5% 1 to 2 patches for 12 hours per day as      needed for pain.   LABORATORY DATA:  His creatinine 1.2, potassium 4.3. White count 6.3,  hemoglobin 11.5. Cardiac markers negative. RPR nonreactive. TSH 1. B12  400. Folate 16.6. Hemoglobin A1c 8.1.   HOSPITAL COURSE:  The patient is 70 years old with history of diabetes,  coronary artery disease, CABG, morbid obesity, chronic neuropathy, and  chronic back pain who presents with weakness and feeling his legs weak  and dizzy. In the emergency room, his chest x-ray and urinalysis all  were negative. Blood work - creatinine 1.7. He does have history of  chronic renal insufficiency and was started on some IV fluids. His  creatinine improved. Physical therapy was consulted. They  recommended  getting SNF for short-term rehabilitation for his weakness. As far as  his work including CT scan of the head was negative, he also had a chest  CT which was negative. The cardiac markers remained negative. Blood  pressure remained stable. As far as his next issue is chronic pain, he  was taking morphine pills at home and is getting severe constipation. He  has had severe constipation with narcotic use in the past including  Vicodin and Percocet. He could not tolerate a Duragesic patch. The  patient was started on bowel regimen with MiraLax, Senokot and Colace  with good result. For his pain, we will continue the Neurontin and  Tylenol with Lidoderm pain patch. At this point, hold off on narcotics.  History of acid reflux disease and gastroparesis, continue on Reglan and  Nexium. As far as his diabetes, continue on Lantus 40 units twice a day  along with adding Amaryl at noon time for his sugar control.  His blood  pressure sugar remained stable. His A1c was elevated at 8.1. As far as  chronic renal insufficiency, his creatinine was 1.28. His baseline  usually runs around 1.7 at home. Because of overall weakness, physical  therapy recommended short-term rehabilitation before going home. The  patient will be going to the nursing facility for rehabilitation.      Georgann Housekeeper, MD  Electronically Signed     KH/MEDQ  D:  10/27/2007  T:  10/27/2007  Job:  [id number]

## 2011-02-11 NOTE — H&P (Signed)
NAMECORNEY, KNIGHTON NO.:  [id number]   MEDICAL RECORD NO.:  [id number]          PATIENT TYPE:  INP   LOCATION:  6527                         FACILITY:  [facility]   PHYSICIAN:  Sabino Donovan, MD        DATE OF BIRTH:  07/20/1941   DATE OF ADMISSION:  10/24/2007  DATE OF DISCHARGE:                              HISTORY & PHYSICAL   PRIMARY CARE PHYSICIAN:  Georgann Housekeeper, M.D.   CHIEF COMPLAINT:  Dizzy/questionable syncope.   HISTORY OF PRESENT ILLNESS:  A 70 year old African American male with a  history of diabetes, coronary artery disease, status post stent  placement and CABG, MI, and hypertension, who presented with a complaint  of dizziness.  Patient reports to me that he was feeling fine until the  morning of presentation when he was drinking coffee and all of a sudden  felt weak.  He felt shaky and nervous.  He got up to go to the bathroom  and continued to feel dizzy.  He reports that he sat down but never lost  consciousness or never passed out, and this feeling lasted for about 1  minute.  After that, he felt better, but had another episode of  shakiness/nervousness in the afternoon.  At that time, he did not have  fainting or loss of consciousness or feeling of dizziness.  At this  time, he presented to the ER.  He further tells me that he had an  episode of left-sided chest pain unrelated to this event without any  associated shortness of breath, diaphoresis or nausea.  He tells me that  he has had this chest pain quite frequently.   Otherwise, patient denies any fever, chills, nausea or vomiting.  He  subsequently denies any seizure-like activity and denies any history of  seizure.  Apparently the patient reported to the nursing staff that he  had chest pain or left-sided flank pain that was radiating down his leg.  The patient further denied any complication or feeling of slow  heartbeat.  Denies any headache.  Denies any vision changes, dysarthria  or  difficulty swallowing.  He further denies any numbness or motor  weakness; just a feeling of shakiness.  He reports that he has been  taking plenty of fluids.  He reports drinking plenty of water everyday,  about 3 jugs a day.  Denies any prior history of similar episode.  No  recent change in medications.   PAST MEDICAL HISTORY:  1. Coronary artery disease, status post stent placement and status      post CABG.  2. Diabetes.  3. Hypertension.   FAMILY HISTORY:  Noncontributory.   SOCIAL HISTORY:  He smokes tobacco, about 1/2 pack per day, for the last  50 years.  No alcohol.   DRUG ALLERGIES:  He has no known drug allergies.   MEDICATIONS:  1. Crestor 5 mg p.o. daily.  2. Benazepril 20 mg p.o. daily.  3. Gabapentin 600 mg p.o. q.i.d.  4. Lasix 40 once a day.  5. Cilostazol 50 once a day.  6. Amlodipine 5  once a day.  7. Omeprazole 40 once a day.  8. Reglan 5 mg p.o. daily.   PHYSICAL EXAMINATION:  Temperature 98.1, pulse 87, respiratory rate 20,  blood pressure 127/70.  GENERAL:  He was in no acute distress, following commands, alert and  oriented x3.  HEENT:  PERRLA, EOMI.  NECK:  No lymphadenopathy, no thyromegaly, no JVD.  CHEST:  Clear to auscultation bilaterally.  HEART:  Regular rate and rhythm.  No murmurs, rubs or gallops.  ABDOMEN:  Soft, nontender, nondistended, normoactive bowel sounds.  EXTREMITIES:  No clubbing, cyanosis or edema noted.  NEUROLOGICAL:  He was focally intact.   LABORATORY DATA:  Sodium 132, potassium 4.0, BUN 22, creatinine 1.6,  white count 7.1, H&H 12.1 and 37.6, platelets 276.  Troponin was less  than 0.05, lactase 24.  UA was negative.  EKG showed normal sinus  rhythm, normal axis, left atrial enlargement, no ST-T wave changes.  He  also had chest x-ray that did not show any infiltrates or effusion.   IMPRESSION:  A 70 year old [place] American male with history of  coronary artery disease, diabetes, and hypertension, who is admitted   with dizziness, now improved.  1. Dizziness/questionable syncope.  I am uncertain whether patient had      syncope or dizziness.  He tells me clearly that he did not lose      consciousness and that he only felt dizzy and felt shaky and      nervous; could be multifactorial. It could be due to volume      depletion versus palpitations versus metabolic.  I have a low      suspicion for seizure or CVA in this patient.  Will give him gentle      IV fluids and follow response.  He already seems back to his      baseline, but reports that he still feels a little bit nervous.  We      will monitor the patient on telemetry for any development of      palpitations.  Will check TSH, free T4, RPR, folate and to look for      other metabolic abnormality.  Otherwise, his chem-10 looks pretty      much unremarkable.  His troponin is negative.  We will obtain      another stat to rule out any acute myocardial infarction.  2. Chronic kidney disease.  Creatinine  currently is 1.6; could be due      to volume depletion and will give IV fluids and see response.  3. Diabetes.  Will continue outpatient medication.  He is not on any      hypoglycemic therapy.  Will check hemoglobin A1c and continue      patient on sliding scale insulin for now.  4. Hypertension.  Currently well controlled.  Will continue benazepril      and amlodipine as well as Lasix.      Sabino Donovan, MD  Electronically Signed     MJ/MEDQ  D:  10/25/2007  T:  10/25/2007  Job:  [id number]   cc:   Georgann Housekeeper, MD

## 2011-02-14 NOTE — Cardiovascular Report (Signed)
[place]  Patient:    [name], [name] Visit Number: [id number] MRN: [id number]          Service Type: MED Location: [phone] Attending Physician:  Lyn Records. Iii Dictated by:   Darci Needle, M.D. Proc. Date: 11/24/01 Admit Date:  11/23/2001   CC:         CVTS  Georgann Housekeeper, M.D.   Cardiac Catheterization  INDICATIONS:  Acute coronary syndrome in this 70 year old African-American gentleman with history of coronary artery disease and prior multiple LAD and circumflex percutaneous coronary interventions including stent implantation.  PROCEDURE: 1. Left heart catheterization. 2. Selective coronary angiography. 3. Left ventriculography.  DESCRIPTION OF PROCEDURE:  After informed consent, the patient was given 50 mcg of IV fentanyl and also 1 mg of IV Versed.  2% Xylocaine was used for local anesthesia.  A 6 French sheath was placed in the right femoral artery using the modified Seldinger technique.  A 6 French A2 multipurpose catheter was then used for hemodynamic recordings, left ventriculography, and selective left coronary angiography.  This catheter was also used for hand injection left ventriculography and also power injection left ventriculography.  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  We then performed attempted angiography with a JR4 6 [place], but ultimately were successful with a 1 cm 6 French left Amplatz catheter.  After reviewing the cineangiograms, it was felt that discussion of possibility of coronary bypass graft surgery versus percutaneous intervention with brachytherapy on the LAD was indicated.  We did a sheathogram in the right iliac to attempt to Perclose, but felt that the entry site was not appropriate for Perclose arteriotomy closure.  RESULTS:  HEMODYNAMIC DATA:  Aortic pressure 159/83, left  ventricular pressure 160/20.  LEFT VENTRICULOGRAPHY:  The left ventricle was dilated.  The mid anterior wall was akinetic.  The apex was moderately to severely hypokinetic. The inferior wall was hypokinetic.  The lateral wall was hypokinetic.  The estimated EF is 25 to 30%.  No obvious mitral regurgitation is noted.  SELECTIVE CORONARY ANGIOGRAPHY: 1. The left main coronary artery is large.  It bifurcates early.  There is    no significant obstruction. 2. Left anterior descending artery is a large vessel in territory that wraps    around the left ventricular apex.  It gives origin to two large diagonal    branches.  There is a stent placed in the proximal LAD that overlaps the    first and second diagonals and also the first septal perforator.  In the    proximal portion of this stented region, there is a focal 95% stenosis that    likely represents the culprit lesion for the patients presentation.  The    large first diagonal and second diagonals are jailed by the stent and    contains some ostial narrowing.  There is significant high grade    obstruction in the midportion of the second diagonal.  The third diagonal    is small and severely and diffusely diseased.  The mid LAD and distal LAD    contains diffuse luminal irregularities, but no focal high grade    obstruction. 3. Circumflex artery.  The circumflex artery under cine fluoroscopy is    demonstrated to contain stents from the proximal vessel to the distal    vessel which extends into the origin of the third obtuse  marginal.    Irregularities were noted.  These stents are placed in an overlapping    manner. There is, perhaps, 50% narrowing within the stented region distally    at the ostium of the large third obtuse marginal. The first and second    obtuse marginal branches appear to be small and are jailed by the stents.    Each containing severe ostial narrowing.  Flow into these obtuse marginal    braches was TIMI 2/3. 4.  Native right coronary artery.  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  It contains    severe proximal tubular stenosis, severe segmental stenosis in the    midvessel at which point it appears to be totally occluded.  The PDA which    arises distally appears to feed retrogradely off of collaterals from the    acute marginal branch.  This PDA may not be graftable.  CONCLUSION:  #1 - Severe diffuse coronary artery disease in this diabetic patient with total occlusion of the mid right coronary artery.  The distal right coronary artery fills by right to right and left to right collaterals.  The culprit lesion for the patients presentation is a high grade in-stent restenosis in the proximal LAD where there is a 99% stenosis.  The first and second diagonals are also severely diseased.  Both appear also to be jailed by the proximal LAD stent.  The LAD distal to the stent in its midportion and around the apex is diffusely diseased.  The circumflex coronary artery contains central endovascular remodeling with stents in the proximal vessel to the distal vessel and extending into the large third obtuse marginal branch. Each of the first two obtuse marginals are very small and contain significant ostial disease.  There is moderate disease in the large third obtuse marginal. RECOMMENDATIONS:  This patient is clearly at increased risk for recurrent ischemic cardiac complications and sudden death due to his severe diffuse coronary artery disease and left ventricular dysfunction.  At this point perhaps his best revascularization approach would be coronary artery bypass graft surgery if he is deemed to be a surgical candidate.  #2 - Left ventricular dysfunction, ischemic.  EF 65%.  PLAN: 1. CVTS consultation for consideration of candidacy for surgery.  If he is    turned down as a surgical candidate would consider cutting balloon      angioplasty followed by brachytherapy of the region of in-stent restenosis    in the LAD. 2. Watch renal function. 3. Aggressive hydration. 4. Hold Avapro until renal function is deemed to be stable. Dictated by:   Darci Needle, M.D. Attending Physician:  Lyn Records. Iii DD:  11/24/01 TD:  11/24/01 Job: [id number]

## 2011-02-14 NOTE — Procedures (Signed)
[place]  Patient:    [name], [name] Visit Number: [id number] MRN: [id number]          Service Type: MED Location: [id number] Attending Physician:  Tressie Stalker Dictated by:   Shela Commons. Claybon Jabs, M.D. Proc. Date: 12/01/01 Admit Date:  11/23/2001                             Procedure Report  DIAGNOSIS:  Coronary artery disease with depressed left ventricular ejection fraction.  PROCEDURE:  Transesophageal echocardiogram.  INDICATION:  [name] is a 70 year old black male who presents to the operating room for coronary artery bypass grafting.  Dr. Salvatore Decent. Cornelius Moras requested TEE for the patients intraoperative management.  DESCRIPTION OF PROCEDURE:  Following the routine cardiac induction, the transesophageal probe was lubricated and carefully inserted into the patients stomach for cardiac imaging.  Initial overall images of the heart were normal. There was no evidence of pericardial effusion or masses.  The right atrium was evaluated and appeared to be free from masses or thrombus.  There was no evidence of flow across the atrial septum.  The tricuspid valve appeared normal in structure with trace regurgitation.  The right ventricle appeared to have good contractility with no evidence of segmental wall motion abnormality. The left atrium was normal in size without evidence of thrombus or mass.  The mitral valve appeared normal in structure with some thickened leaflet but overall seemed to function normally with minimal regurgitation and no stenosis.  The left ventricle had some anterior septal hypokinesis with a moderately depressed overall contractility.  The aortic valve had three leaflets. Appeared normal in structure with trace regurgitation and no stenosis.  The patient underwent coronary artery bypass grafting.  Upon separation from the bypass machine, the patients contractility seemed to be somewhat improved.  The patient continued  to do well with increased contractility of the left ventricle.  The patient remained stable.  The transesophageal probe was carefully removed and the patient was taken to the SICU in good condition.  The patient tolerated the procedure well. Dictated by:   Shela Commons. Claybon Jabs, M.D. Attending Physician:  Tressie Stalker DD:  12/01/01 TD:  12/02/01 Job: [id number]

## 2011-02-14 NOTE — Consult Note (Signed)
[name], [name] NO.:  [id number]   MEDICAL RECORD NO.:  [id number]          PATIENT TYPE:  INP   LOCATION:  4730                         FACILITY:  [facility]   PHYSICIAN:  Shirley Friar, MDDATE OF BIRTH:  July 11, 1941   DATE OF CONSULTATION:  02/13/2006  DATE OF DISCHARGE:                                   CONSULTATION   INDICATIONS:  Abdominal pain.   HISTORY OF PRESENT ILLNESS:  This consult was completed at the request of  Dr. Donette Larry with regards to the patient's abdominal pain.  The patient has  had multiple admissions over the past couple of years secondary to abdominal  pain and chronic constipation.  He presents with a three week history of  diffuse abdominal pain and occasional nausea and vomiting.  When he has  occasional vomiting, sometimes it is vomiting of undigested food.  He  reports that he moves his bowels about twice per week with the help of  Dulcolax, but for the past three weeks he has not had a bowel movement.  His  last colonoscopy was in 2004 by Dr. Laural Benes and it was normal.  He has a  significant past medical history as stated below.  In regards to his  diabetes, he reports that he has good control.  He is accompanied by his  wife who is frustrated with his frequent constipation and is rightfully  concerned about his frequent recurrences.   On presentation to the emergency room, his lipase was mildly elevated at 84,  amylase was normal at 97.  White blood count was normal, hemoglobin was  11.7, recheck it was 14.6.   PAST MEDICAL HISTORY:  1.  Diabetes mellitus type 2.  2.  Hypertension.  3.  Coronary artery disease status post CABG.  4.  Peripheral neuropathy.  5.  Chronic constipation.  6.  Morbid obesity.  7.  Hyperlipidemia.  8.  Obstructive sleep apnea.  9.  Chronic back pain.  10. Chronic renal insufficiency.  11. History glaucoma  12. Back surgery x2.  13. Status post cholecystectomy.   MEDICATIONS ON ADMISSION:   Glycolax, Gabapentin, Nexium, metoprolol,  glimepiride, Furosemide, Avapro, Avandia, amlodipine, Diovan, Flexeril,  Lantus, Humalog.   ALLERGIES:  NO KNOWN DRUG ALLERGIES.   REVIEW OF SYSTEMS:  Negative except as above.   PHYSICAL EXAMINATION:  VITAL SIGNS:  Temperature 98.1, pulse 73, blood pressure 143/69, O2 sat 94%  on room air.  GENERAL:  Morbidly obese, alert in no acute distress.  HEENT:  Nonicteric sclerae.  CHEST:  Clear to auscultation bilaterally.  CARDIOVASCULAR:  Regular rate and rhythm without murmurs.  ABDOMEN:  Diffusely tender with guarding, no rebound, soft, bowel  distension, positive bowel sounds.  EXTREMITIES:  No edema.   LABORATORY DATA:  Hemoglobin 14.6, hematocrit 43, white blood count 8.  INR  1.  Potassium 4.6, BUN 39, creatinine 2.  T-bili 0.5, ALP 71, amylase 97,  lipase 84, ALP 71, AST 39, ALT 28.   IMPRESSION:  70 year old black male with recurrent abdominal pain and  constipation presents with a three week history  of no bowel movement likely  leading to his current abdominal pain.  I doubt he has any component of  pancreatitis despite is mildly elevated lipase.  I suspect his abdominal  pain is all secondary to constipation alone versus a combination of  constipation and diabetic dysmotility.  He does have a component of  gastroparesis with his occasional vomiting of undigested food, but I think  his main problem is slow transit constipation which is exacerbated by  diabetes.   PLAN:  Due to his renal insufficiency, unable to do a IV contrast study and  probably no benefit from doing an MRI of the abdomen with Gadolinium which  is also an increased risk due to his renal insufficiency.  I would recommend  some abdominal films to rule out obstruction.  I would flat and upright of  his abdomen to rule out obstruction and will be aggressive with Fleet's  enemas and MiraLax or GoLYTELY to try to get his bowels moving.  May need  may need to do a  gastric emptying study to confirm gastroparesis and address  with additional studies will be needed as an inpatient if his bowels do move  as they have on previous hospitalizations in a short period time.  The  patient also reports that he is scheduled for repeat colonoscopy by Dr.  Laural Benes in June and I have instructed him to keep that appointment.      Shirley Friar, MD  Electronically Signed     VCS/MEDQ  D:  02/13/2006  T:  02/14/2006  Job:  [id number]

## 2011-02-14 NOTE — Op Note (Signed)
NAMEARREN, Donald [name], M.D.    DATE OF BIRTH:  01-Dec-1940   DATE OF PROCEDURE:  02/16/2006  DATE OF DISCHARGE:                                 OPERATIVE REPORT   PROCEDURE:  EGD with biopsy.   INDICATIONS:  Gastroparesis.  Want to rule out structural abnormality.  Consent was signed after risks, benefits, methods, and options were  thoroughly discussed prior to any premeds given.   MEDICINES USED:  Fentanyl 35 mcg, Versed 3 mg.   PROCEDURE:  The video endoscope was inserted by direct vision.  Esophagus  was normal.  He did have a tiny hiatal hernia.  The scope was passed into  the stomach to view the antrum where some mild antritis was seen and then  was advanced through a normal pylorus into a normal duodenal bulb and around  the C-loop to normal second portion of the duodenum. To advance into the  duodenum did require totally deflating his stomach due to it being a rather  large stomach. Could not advance from the pylorus with it maximally  inflated.  __________  Obstruction was seen.  Scope withdrawn back to the  bulb and __________  Scope was withdrawn back to the stomach and  retroflexed.  Cardia, fundus, angularis, lesser and greater curve were  evaluated on retroflexion and straight visualization.  He did have some  moderate nodular proximal gastritis which was biopsied on retroflexion and a  few biopsies in the antrum were obtained as well.  Other than the gastritis,  no additional __________  seen. Air was suctioned and scope was slowly  withdrawn. Again a good look at the esophagus was normal.  The scope was  removed.  The patient tolerated the procedure well.  The was no obvious  immediate complication.   ENDOSCOPIC ASSESSMENT:  1.  Tiny hiatal hernia.  2.  Proximal nodular gastritis,  status post biopsy, mild antritis status      post biopsy.  3.  Otherwise normal EGD without signs of obstruction.   PLAN:  Await pathology. Continue pump inhibitors and Reglan.  We will follow  while he is in the hospital and probably follow up in the office with Dr.  Laural Benes to reassess symptoms and make sure no further workup and plan is  needed like repeat colonoscopy or small bowel follow through.           ______________________________  Petra Kuba, M.D.     MEM/MEDQ  D:  02/16/2006  T:  02/17/2006  Job:  [id number]   cc:   Georgann Housekeeper, MD  Fax: [phone]   Danise Edge, M.D.  Fax: [phone]

## 2011-06-19 LAB — POCT CARDIAC MARKERS
CKMB, poc: 2.3
Troponin i, poc: <0.05

## 2011-06-19 LAB — BASIC METABOLIC PANEL
BUN: 18
Chloride: 105
Creatinine, Ser: 1.28
GFR calc non Af Amer: 56 — ABNORMAL LOW

## 2011-06-19 LAB — COMPREHENSIVE METABOLIC PANEL
ALT: 13
ALT: 13
AST: 15
CO2: 23
Calcium: 8.8
Calcium: 8.9
GFR calc Af Amer: 52 — ABNORMAL LOW
Glucose, Bld: 111 — ABNORMAL HIGH
Potassium: 4
Sodium: 132 — ABNORMAL LOW
Sodium: 136
Total Protein: 6.6
Total Protein: 6.8

## 2011-06-19 LAB — CK TOTAL AND CKMB (NOT AT ARMC)
CK, MB: 2.9
Total CK: 158

## 2011-06-19 LAB — URINALYSIS, ROUTINE W REFLEX MICROSCOPIC
Bilirubin Urine: NEGATIVE
Glucose, UA: NEGATIVE
Hgb urine dipstick: NEGATIVE
Ketones, ur: NEGATIVE
pH: 5.5

## 2011-06-19 LAB — DIFFERENTIAL
Eosinophils Absolute: 0.1
Eosinophils Relative: 2
Lymphs Abs: 2.6
Monocytes Relative: 10

## 2011-06-19 LAB — RPR: RPR Ser Ql: NONREACTIVE

## 2011-06-19 LAB — CBC
MCHC: 32
MCHC: 32.1
RBC: 5.05
RBC: 5.33
RDW: 18.3 — ABNORMAL HIGH
WBC: 6.3

## 2011-06-19 LAB — FOLATE: Folate: 16.6

## 2011-06-19 LAB — T4, FREE: Free T4: 1.12

## 2011-06-19 LAB — TSH: TSH: 1.082

## 2011-06-19 LAB — HEMOGLOBIN A1C
Hgb A1c MFr Bld: 8.1 — ABNORMAL HIGH
Mean Plasma Glucose: 211

## 2011-10-31 ENCOUNTER — Other Ambulatory Visit: Payer: Self-pay

## 2011-10-31 ENCOUNTER — Emergency Department (HOSPITAL_COMMUNITY)
Admission: EM | Admit: 2011-10-31 | Discharge: 2011-10-31 | Disposition: A | Discharge status: Home / Self Care | Payer: Medicare Other | Attending: Emergency Medicine | Admitting: Emergency Medicine

## 2011-10-31 ENCOUNTER — Encounter (HOSPITAL_COMMUNITY): Payer: Self-pay | Admitting: Emergency Medicine

## 2011-10-31 DIAGNOSIS — R0682 Tachypnea, not elsewhere classified: Secondary | ICD-10-CM | POA: Insufficient documentation

## 2011-10-31 DIAGNOSIS — R339 Retention of urine, unspecified: Secondary | ICD-10-CM

## 2011-10-31 DIAGNOSIS — I509 Heart failure, unspecified: Secondary | ICD-10-CM | POA: Insufficient documentation

## 2011-10-31 DIAGNOSIS — R6883 Chills (without fever): Secondary | ICD-10-CM | POA: Insufficient documentation

## 2011-10-31 DIAGNOSIS — R10819 Abdominal tenderness, unspecified site: Secondary | ICD-10-CM | POA: Insufficient documentation

## 2011-10-31 DIAGNOSIS — I2510 Atherosclerotic heart disease of native coronary artery without angina pectoris: Secondary | ICD-10-CM | POA: Insufficient documentation

## 2011-10-31 DIAGNOSIS — Z79899 Other long term (current) drug therapy: Secondary | ICD-10-CM | POA: Insufficient documentation

## 2011-10-31 DIAGNOSIS — E860 Dehydration: Secondary | ICD-10-CM

## 2011-10-31 DIAGNOSIS — R079 Chest pain, unspecified: Secondary | ICD-10-CM | POA: Insufficient documentation

## 2011-10-31 DIAGNOSIS — E119 Type 2 diabetes mellitus without complications: Secondary | ICD-10-CM | POA: Insufficient documentation

## 2011-10-31 HISTORY — DX: Heart failure, unspecified: I50.9

## 2011-10-31 HISTORY — DX: Unspecified osteoarthritis, unspecified site: M19.90

## 2011-10-31 HISTORY — DX: Atherosclerotic heart disease of native coronary artery without angina pectoris: I25.10

## 2011-10-31 LAB — TROPONIN I: Troponin I: <0.3 ng/mL (ref <0.30)

## 2011-10-31 LAB — URINALYSIS, MICROSCOPIC ONLY
Bilirubin Urine: NEGATIVE
Glucose, UA: NEGATIVE mg/dL
Ketones, ur: NEGATIVE mg/dL
Leukocytes, UA: NEGATIVE
Nitrite: NEGATIVE
Protein, ur: NEGATIVE mg/dL
Specific Gravity, Urine: 1.018 (ref 1.005–1.030)
Urobilinogen, UA: 1 mg/dL (ref 0.0–1.0)
pH: 5.5 (ref 5.0–8.0)

## 2011-10-31 LAB — DIFFERENTIAL
Basophils Absolute: 0 10*3/uL (ref 0.0–0.1)
Basophils Relative: 0 % (ref 0–1)
Eosinophils Absolute: 0.2 K/uL (ref 0.0–0.7)
Eosinophils Relative: 3 % (ref 0–5)
Lymphocytes Relative: 31 % (ref 12–46)
Lymphs Abs: 2.2 K/uL (ref 0.7–4.0)
Monocytes Absolute: 0.6 K/uL (ref 0.1–1.0)
Monocytes Relative: 9 % (ref 3–12)
Neutro Abs: 4.1 K/uL (ref 1.7–7.7)
Neutrophils Relative %: 58 % (ref 43–77)

## 2011-10-31 LAB — COMPREHENSIVE METABOLIC PANEL WITH GFR
Alkaline Phosphatase: 105 U/L (ref 39–117)
BUN: 41 mg/dL — ABNORMAL HIGH (ref 6–23)
Chloride: 103 meq/L (ref 96–112)
Creatinine, Ser: 1.94 mg/dL — ABNORMAL HIGH (ref 0.50–1.35)
GFR calc Af Amer: 38 mL/min — ABNORMAL LOW (ref >90)
GFR calc non Af Amer: 33 mL/min — ABNORMAL LOW (ref >90)
Glucose, Bld: 208 mg/dL — ABNORMAL HIGH (ref 70–99)
Potassium: 4.5 meq/L (ref 3.5–5.1)
Total Bilirubin: 0.1 mg/dL — ABNORMAL LOW (ref 0.3–1.2)

## 2011-10-31 LAB — CBC
HCT: 32.2 % — ABNORMAL LOW (ref 39.0–52.0)
Hemoglobin: 10.1 g/dL — ABNORMAL LOW (ref 13.0–17.0)
MCH: 23.6 pg — ABNORMAL LOW (ref 26.0–34.0)
MCHC: 31.4 g/dL (ref 30.0–36.0)
MCV: 75.2 fL — ABNORMAL LOW (ref 78.0–100.0)
Platelets: 188 10*3/uL (ref 150–400)
RBC: 4.28 MIL/uL (ref 4.22–5.81)
RDW: 17 % — ABNORMAL HIGH (ref 11.5–15.5)
WBC: 7.1 10*3/uL (ref 4.0–10.5)

## 2011-10-31 LAB — COMPREHENSIVE METABOLIC PANEL
ALT: 11 U/L (ref 0–53)
AST: 11 U/L (ref 0–37)
Albumin: 2.9 g/dL — ABNORMAL LOW (ref 3.5–5.2)
CO2: 24 mEq/L (ref 19–32)
Calcium: 9.1 mg/dL (ref 8.4–10.5)
Sodium: 137 mEq/L (ref 135–145)
Total Protein: 6.7 g/dL (ref 6.0–8.3)

## 2011-10-31 MED ORDER — SODIUM CHLORIDE 0.9 % IV BOLUS (SEPSIS)
500.0000 mL | Freq: Once | INTRAVENOUS | Status: AC
  Administered 2011-10-31: 500 mL via INTRAVENOUS

## 2011-10-31 NOTE — ED Notes (Signed)
MD at bedside. 

## 2011-10-31 NOTE — ED Provider Notes (Signed)
History     CSN: [id number]  Arrival date & time 10/31/11  1510   First MD Initiated Contact with Patient 10/31/11 1515      Chief Complaint  Patient presents with  . Weakness  . Urinary Retention    (Consider location/radiation/quality/duration/timing/severity/associated sxs/prior treatment) HPI Comments: Patient reports that he's had difficulty with urination but denies any dysuria that's been gradually getting worse over the last several days. Patient's spouse reports a few weeks ago he was seen by Dr. Eula Listen, his primary care physician, who reports that his kidneys were getting worse. When asked the patient denies a history of renal insufficiency, however his blood tests from approximately one year ago does show elevated creatinines. Patient's spouse reports that when he does urinate a small amount has a very foul odor. Patient has been treated for urinary tract infection in the past. Patient was supposed to followup with Dr. Rene Paci on Monday, however due to symptoms, the clinic nurse inform them to go ahead and come to the emergency department for evaluation. The patient arrives by ambulance. He does complain of some low back pain as well as having intermittent muscle cramps all over his body. He primarily started off complaining of muscle cramps saying that it's been hurting around his left shoulder, left upper extremity, left upper chest area. However he reports that the same kind of discomfort has been plaguing him around his abdomen and both of his legs as well. The patient has had difficulty ambulating because of these crampy discomfort. Patient's spouse reports that his legs and his abdomen has become more swollen as well. He denies shortness of breath, fever, vomiting, diaphoresis. He reports that he has had some cold chills  Patient is a 71 y.o. male presenting with weakness. The history is provided by the patient, the spouse and medical records.  Weakness Primary symptoms do not  include headaches, nausea or vomiting.  Additional symptoms include weakness.    Past Medical History  Diagnosis Date  . Coronary artery disease   . Diabetes mellitus   . CHF (congestive heart failure)   . Renal disorder   . Arthritis     Past Surgical History  Procedure Date  . Back surgery   . Cardiac surgery   . Cholecystectomy   . Tonsillectomy   . Eye surgery     No family history on file.  History  Substance Use Topics  . Smoking status: Former Games developer  . Smokeless tobacco: Not on file  . Alcohol Use: No      Review of Systems  Constitutional: Positive for chills and fatigue.  Cardiovascular: Positive for chest pain.  Gastrointestinal: Negative for nausea, vomiting and diarrhea.  Genitourinary: Positive for decreased urine volume.  Musculoskeletal: Positive for myalgias and back pain.  Neurological: Positive for weakness. Negative for numbness and headaches.  All other systems reviewed and are negative.    Allergies  Review of patient's allergies indicates no known allergies.  Home Medications   Current Outpatient Rx  Name Route Sig Dispense Refill  . BENAZEPRIL HCL 20 MG PO TABS Oral Take 20 mg by mouth 2 (two) times daily.    Marland Kitchen FLUOXETINE HCL 20 MG PO TABS Oral Take 20 mg by mouth daily.    Marland Kitchen GABAPENTIN 600 MG PO TABS Oral Take 600 mg by mouth 4 (four) times daily.    Marland Kitchen GLIMEPIRIDE 2 MG PO TABS Oral Take 2 mg by mouth daily before breakfast.    . ISOSORBIDE MONONITRATE ER  60 MG PO TB24 Oral Take 60 mg by mouth daily.    Marland Kitchen OMEPRAZOLE 40 MG PO CPDR Oral Take 40 mg by mouth daily.    . OXYCODONE-ACETAMINOPHEN 10-325 MG PO TABS Oral Take 1 tablet by mouth every 8 (eight) hours as needed.    Marland Kitchen POLYETHYL GLYCOL-PROPYL GLYCOL 0.4-0.3 % OP SOLN Left Eye Place 1 drop into the left eye.    Marland Kitchen SIMVASTATIN 20 MG PO TABS Oral Take 20 mg by mouth at bedtime.    . TAMSULOSIN HCL 0.4 MG PO CAPS Oral Take by mouth daily. One capsule 30 mins after the same meal each  day      BP 127/77  Pulse 72  Temp(Src) 98.6 F (37 C) (Oral)  Resp 20  SpO2 100%  Physical Exam  Nursing note and vitals reviewed. Constitutional: He appears well-developed and well-nourished.  HENT:  Head: Normocephalic and atraumatic.  Eyes: Pupils are equal, round, and reactive to light. No scleral icterus.  Neck: Normal range of motion. Neck supple.  Cardiovascular: Normal rate.   Pulmonary/Chest: Tachypnea noted. No respiratory distress. He has no decreased breath sounds.  Abdominal: Soft. Normal appearance and bowel sounds are normal. There is tenderness in the suprapubic area. There is no rigidity, no rebound, no guarding and no CVA tenderness.  Musculoskeletal:       No deformity seen. Patient is morbidly obese. There is no hitting his any lower extremity patient reports mild tenderness with palpation of his left upper arm between his elbow and up to the left paraspinal neck area. Passively, patient has normal range of motion of his neck as well as his left shoulder. His distal strength as far as grip and movement of his left wrist is normal.  Skin: Skin is warm, dry and intact. No rash noted. No cyanosis.  Psychiatric: He has a normal mood and affect.    ED Course  Procedures (including critical care time)  Labs Reviewed  CBC - Abnormal; Notable for the following:    Hemoglobin 10.1 (*)    HCT 32.2 (*)    MCV 75.2 (*)    MCH 23.6 (*)    RDW 17.0 (*)    All other components within normal limits  COMPREHENSIVE METABOLIC PANEL - Abnormal; Notable for the following:    Glucose, Bld 208 (*)    BUN 41 (*)    Creatinine, Ser 1.94 (*)    Albumin 2.9 (*)    Total Bilirubin 0.1 (*)    GFR calc non Af Amer 33 (*)    GFR calc Af Amer 38 (*)    All other components within normal limits  URINALYSIS, WITH MICROSCOPIC - Abnormal; Notable for the following:    Hgb urine dipstick LARGE (*)    All other components within normal limits  DIFFERENTIAL  TROPONIN I  URINE CULTURE    No results found.   1. Urinary retention   2. Dehydration     ECG done at 15:45, shows sinus rhythm at rate of 72. Normal axis. Nonspecific T wave abnormalities seen in leads 1 and aVL as well as V1 and V2. No other ST or T-wave abnormalities. Normal intervals. Compared to EKG from 07/26/2009, T wave inversion in lead V2 is now seen.  Patient's room air saturations are between 94-97% which is interpreted be normal.  MDM  Check UA, place foley, I suspect possible UTI and possible renal failure.  Cramping may be due to electrolyte abn due to renal failure.  I doubt CP, which sounds more like muscle cramps is cardiac in etiology.        4:44 PM Patient's electrolytes are normal and baseline renal insufficiency is stable, except BUN is doubled to 44.  Suggests some dehydration.  May explain the cramping.  Will give an IVF bolus while awaiting UA results.    6:40 PM Updated pt and family.  He reports feeling "ok."  IVF's being given.  Will give PO's.  Troponin is normal.     7:30 PM Pt has received IVF's, feel improved, is able to tolerate PO's.  Will leave foley catheter in place and have him follow up with Dr. Eula Listen on Monday as previously arranged.    Gavin Pound. Oletta Lamas, MD 10/31/11 1931

## 2011-10-31 NOTE — ED Notes (Signed)
Pt and family made aware that we are waiting for urine to be resulted in lab

## 2011-10-31 NOTE — ED Notes (Signed)
[id number]<AV> Expected date:10/31/11<BR> Expected time: 2:56 PM<BR> Means of arrival:Ambulance<BR> Comments:<BR> M80 - 70yoM low back pain, trouble urinating

## 2011-10-31 NOTE — ED Notes (Signed)
Pt reports urinary retentions (not voiding much) for three days, worse today. Also has had left shoulder arm pain for three days.

## 2011-11-03 LAB — URINE CULTURE
Colony Count: >=100000
Culture  Setup Time: 201302020116

## 2011-11-04 NOTE — ED Notes (Addendum)
+   Urine Patient followed up with Dr Eula Listen on Monday.

## 2011-12-14 ENCOUNTER — Inpatient Hospital Stay (HOSPITAL_COMMUNITY)
Admission: EM | Admit: 2011-12-14 | Discharge: 2011-12-22 | DRG: 194 | Disposition: A | Discharge status: Skilled Nursing Facility | Payer: Medicare Other | Attending: Internal Medicine | Admitting: Internal Medicine

## 2011-12-14 ENCOUNTER — Emergency Department (HOSPITAL_COMMUNITY): Payer: Medicare Other

## 2011-12-14 ENCOUNTER — Encounter (HOSPITAL_COMMUNITY): Payer: Self-pay | Admitting: Adult Health

## 2011-12-14 DIAGNOSIS — N184 Chronic kidney disease, stage 4 (severe): Secondary | ICD-10-CM | POA: Diagnosis present

## 2011-12-14 DIAGNOSIS — IMO0002 Reserved for concepts with insufficient information to code with codable children: Secondary | ICD-10-CM | POA: Diagnosis present

## 2011-12-14 DIAGNOSIS — J209 Acute bronchitis, unspecified: Secondary | ICD-10-CM

## 2011-12-14 DIAGNOSIS — K219 Gastro-esophageal reflux disease without esophagitis: Secondary | ICD-10-CM

## 2011-12-14 DIAGNOSIS — W19XXXA Unspecified fall, initial encounter: Secondary | ICD-10-CM

## 2011-12-14 DIAGNOSIS — R5381 Other malaise: Secondary | ICD-10-CM

## 2011-12-14 DIAGNOSIS — E785 Hyperlipidemia, unspecified: Secondary | ICD-10-CM

## 2011-12-14 DIAGNOSIS — J440 Chronic obstructive pulmonary disease with acute lower respiratory infection: Secondary | ICD-10-CM | POA: Diagnosis present

## 2011-12-14 DIAGNOSIS — J9610 Chronic respiratory failure, unspecified whether with hypoxia or hypercapnia: Secondary | ICD-10-CM

## 2011-12-14 DIAGNOSIS — R262 Difficulty in walking, not elsewhere classified: Secondary | ICD-10-CM | POA: Diagnosis present

## 2011-12-14 DIAGNOSIS — M1990 Unspecified osteoarthritis, unspecified site: Secondary | ICD-10-CM | POA: Diagnosis present

## 2011-12-14 DIAGNOSIS — N183 Chronic kidney disease, stage 3 unspecified: Secondary | ICD-10-CM

## 2011-12-14 DIAGNOSIS — I129 Hypertensive chronic kidney disease with stage 1 through stage 4 chronic kidney disease, or unspecified chronic kidney disease: Secondary | ICD-10-CM | POA: Diagnosis present

## 2011-12-14 DIAGNOSIS — Z79899 Other long term (current) drug therapy: Secondary | ICD-10-CM

## 2011-12-14 DIAGNOSIS — R296 Repeated falls: Secondary | ICD-10-CM

## 2011-12-14 DIAGNOSIS — M25551 Pain in right hip: Secondary | ICD-10-CM

## 2011-12-14 DIAGNOSIS — Z9181 History of falling: Secondary | ICD-10-CM

## 2011-12-14 DIAGNOSIS — Z87891 Personal history of nicotine dependence: Secondary | ICD-10-CM

## 2011-12-14 DIAGNOSIS — G4733 Obstructive sleep apnea (adult) (pediatric): Secondary | ICD-10-CM

## 2011-12-14 DIAGNOSIS — Z6841 Body Mass Index (BMI) 40.0 and over, adult: Secondary | ICD-10-CM

## 2011-12-14 DIAGNOSIS — Z951 Presence of aortocoronary bypass graft: Secondary | ICD-10-CM

## 2011-12-14 DIAGNOSIS — J189 Pneumonia, unspecified organism: Principal | ICD-10-CM

## 2011-12-14 DIAGNOSIS — E119 Type 2 diabetes mellitus without complications: Secondary | ICD-10-CM | POA: Diagnosis present

## 2011-12-14 DIAGNOSIS — Z9981 Dependence on supplemental oxygen: Secondary | ICD-10-CM

## 2011-12-14 DIAGNOSIS — N400 Enlarged prostate without lower urinary tract symptoms: Secondary | ICD-10-CM

## 2011-12-14 DIAGNOSIS — I2510 Atherosclerotic heart disease of native coronary artery without angina pectoris: Secondary | ICD-10-CM | POA: Diagnosis present

## 2011-12-14 DIAGNOSIS — I509 Heart failure, unspecified: Secondary | ICD-10-CM | POA: Diagnosis present

## 2011-12-14 DIAGNOSIS — D509 Iron deficiency anemia, unspecified: Secondary | ICD-10-CM

## 2011-12-14 DIAGNOSIS — N179 Acute kidney failure, unspecified: Secondary | ICD-10-CM | POA: Diagnosis not present

## 2011-12-14 DIAGNOSIS — I10 Essential (primary) hypertension: Secondary | ICD-10-CM

## 2011-12-14 DIAGNOSIS — E662 Morbid (severe) obesity with alveolar hypoventilation: Secondary | ICD-10-CM | POA: Diagnosis present

## 2011-12-14 HISTORY — DX: Chronic obstructive pulmonary disease, unspecified: J44.9

## 2011-12-14 HISTORY — DX: Essential (primary) hypertension: I10

## 2011-12-14 HISTORY — DX: Obstructive sleep apnea (adult) (pediatric): G47.33

## 2011-12-14 HISTORY — DX: Chronic kidney disease, stage 3 unspecified: N18.30

## 2011-12-14 HISTORY — DX: Obesity, class 3: E66.813

## 2011-12-14 HISTORY — DX: Chronic respiratory failure, unspecified whether with hypoxia or hypercapnia: J96.10

## 2011-12-14 HISTORY — DX: Iron deficiency anemia, unspecified: D50.9

## 2011-12-14 HISTORY — DX: Hyperlipidemia, unspecified: E78.5

## 2011-12-14 HISTORY — DX: Benign prostatic hyperplasia without lower urinary tract symptoms: N40.0

## 2011-12-14 HISTORY — DX: Gastro-esophageal reflux disease without esophagitis: K21.9

## 2011-12-14 HISTORY — DX: Morbid (severe) obesity due to excess calories: E66.01

## 2011-12-14 HISTORY — DX: Chronic kidney disease, stage 3 (moderate): N18.3

## 2011-12-14 LAB — CBC
HCT: 36.7 % — ABNORMAL LOW (ref 39.0–52.0)
Hemoglobin: 11.3 g/dL — ABNORMAL LOW (ref 13.0–17.0)
MCH: 23.3 pg — ABNORMAL LOW (ref 26.0–34.0)
MCHC: 30.8 g/dL (ref 30.0–36.0)
MCV: 75.7 fL — ABNORMAL LOW (ref 78.0–100.0)
RBC: 4.85 MIL/uL (ref 4.22–5.81)

## 2011-12-14 LAB — DIFFERENTIAL
Basophils Relative: 0 % (ref 0–1)
Eosinophils Absolute: 0.2 10*3/uL (ref 0.0–0.7)
Eosinophils Relative: 3 % (ref 0–5)
Lymphs Abs: 2.9 10*3/uL (ref 0.7–4.0)
Monocytes Absolute: 0.6 10*3/uL (ref 0.1–1.0)
Neutro Abs: 3 10*3/uL (ref 1.7–7.7)

## 2011-12-14 LAB — URINALYSIS, ROUTINE W REFLEX MICROSCOPIC
Bilirubin Urine: NEGATIVE
Glucose, UA: NEGATIVE mg/dL
Ketones, ur: NEGATIVE mg/dL
Leukocytes, UA: NEGATIVE
Specific Gravity, Urine: 1.017 (ref 1.005–1.030)
pH: 6 (ref 5.0–8.0)

## 2011-12-14 LAB — BASIC METABOLIC PANEL
BUN: 16 mg/dL (ref 6–23)
CO2: 26 mEq/L (ref 19–32)
Chloride: 104 mEq/L (ref 96–112)
Creatinine, Ser: 1.23 mg/dL (ref 0.50–1.35)
Glucose, Bld: 53 mg/dL — ABNORMAL LOW (ref 70–99)

## 2011-12-14 MED ORDER — TRAMADOL HCL 50 MG PO TABS: 50.0000 mg | ORAL_TABLET | Freq: Four times a day (QID) | ORAL | Status: AC | PRN

## 2011-12-14 NOTE — ED Notes (Signed)
Multiple iv attempts done between 2100-2215 via two RN

## 2011-12-14 NOTE — ED Provider Notes (Signed)
History     CSN: [id number]  Arrival date & time 12/14/11  1509   First MD Initiated Contact with Patient 12/14/11 [phone]      Chief Complaint  Patient presents with  . Fall  . Hip Pain    (Consider location/radiation/quality/duration/timing/severity/associated sxs/prior treatment) Patient is a 71 y.o. male presenting with fall. The history is provided by the patient.  Fall Pertinent negatives include no fever, no numbness and no headaches.   patient with a fall from standing on Wednesday after his walker fell underneath his weight. Landed on his right side. Complains of pain to bilateral hips and lower legs. He has been ambulating since the fall. There was no head injury or loss of consciousness in the fall. In relation makes the symptoms worse, nothing makes it better. No prior evaluation or treatment  Past Medical History  Diagnosis Date  . Coronary artery disease   . Diabetes mellitus   . CHF (congestive heart failure)   . Renal disorder   . Arthritis   . COPD (chronic obstructive pulmonary disease)     Past Surgical History  Procedure Date  . Back surgery   . Cardiac surgery   . Cholecystectomy   . Tonsillectomy   . Eye surgery     History reviewed. No pertinent family history.  History  Substance Use Topics  . Smoking status: Former Games developer  . Smokeless tobacco: Not on file  . Alcohol Use: No      Review of Systems  Constitutional: Negative for fever and chills.  Genitourinary: Positive for dysuria.  Musculoskeletal:       See HPI, otherwise negative  Neurological: Negative for syncope, weakness, numbness and headaches.    Allergies  Review of patient's allergies indicates no known allergies.  Home Medications   Current Outpatient Rx  Name Route Sig Dispense Refill  . BENAZEPRIL HCL 20 MG PO TABS Oral Take 20 mg by mouth 2 (two) times daily.    Marland Kitchen CLOTRIMAZOLE-BETAMETHASONE 1-0.05 % EX CREA Topical Apply 1 application topically 2 (two) times daily.  Applies to affected area on legs.    Marland Kitchen FLUOXETINE HCL 20 MG PO TABS Oral Take 20 mg by mouth daily.    Marland Kitchen GABAPENTIN 600 MG PO TABS Oral Take 600 mg by mouth 4 (four) times daily.    Marland Kitchen GLIMEPIRIDE 2 MG PO TABS Oral Take 2 mg by mouth daily before breakfast.    . ISOSORBIDE MONONITRATE ER 60 MG PO TB24 Oral Take 60 mg by mouth daily.    Marland Kitchen OMEPRAZOLE 40 MG PO CPDR Oral Take 40 mg by mouth daily.    . OXYCODONE-ACETAMINOPHEN 10-325 MG PO TABS Oral Take 1 tablet by mouth every 8 (eight) hours as needed. For pain.    Marland Kitchen POLYETHYL GLYCOL-PROPYL GLYCOL 0.4-0.3 % OP SOLN Left Eye Place 1 drop into the left eye daily.     Marland Kitchen SIMVASTATIN 20 MG PO TABS Oral Take 20 mg by mouth at bedtime.    . TAMSULOSIN HCL 0.4 MG PO CAPS Oral Take by mouth daily. One capsule 30 mins after the same meal each day      BP 153/126  Pulse 84  Temp 99.2 F (37.3 C)  Resp 16  SpO2 92%  Physical Exam  Constitutional: He is oriented to person, place, and time. He appears well-developed. No distress.       obese  HENT:  Head: Normocephalic and atraumatic.  Right Ear: External ear normal.  Left Ear: External  ear normal.  Mouth/Throat: Oropharynx is clear and moist.  Eyes: EOM are normal. Pupils are equal, round, and reactive to light.  Neck: Normal range of motion. Neck supple.  Cardiovascular: Normal rate and regular rhythm.   Pulmonary/Chest: Effort normal. No respiratory distress. He has no wheezes. He exhibits no tenderness.  Abdominal: Soft. Bowel sounds are normal. He exhibits no distension. There is no tenderness.  Musculoskeletal: He exhibits no edema.       Full passive ROM to all joints with moderate pain on bilateral hip flexion. Pain to palpation over bilateral hips without deformity or palpable crepitus on ROM. Knees non-tender and without deformity. Bilateral shins TTP without deformity or overlying skin changes. Bilateral ankles non-tender. Strength of bilateral hip flexion 4/5. Bilateral knee  flexion/extension 4/5. Foot plantar/dorsi flexion and bilateral great toe flex/ext 5/5. No limb shortening or rotation.  Neurological: He is alert and oriented to person, place, and time.  Skin: Skin is warm and dry.    ED Course  Procedures (including critical care time)   Labs Reviewed  URINALYSIS, ROUTINE W REFLEX MICROSCOPIC  GLUCOSE, CAPILLARY   Dg Pelvis 1-2 Views  12/14/2011  *RADIOLOGY REPORT*  Clinical Data: Fall, hip pain  PELVIS - 1-2 VIEW  Comparison: 12/03/2010  Findings: The study is markedly limited due to the patient's large body habitus.  A single frontal view of the pelvis shows no gross fracture or subluxation.  IMPRESSION: Suboptimal study as described above.  No gross fracture or subluxation.  Original Report Authenticated By: Natasha Mead, M.D.   Dg Tibia/fibula Left  12/14/2011  *RADIOLOGY REPORT*  Clinical Data: Leg pain  LEFT TIBIA AND FIBULA - 2 VIEW  Comparison: None.  Findings: No fracture or dislocation of the left tibia-fibula.  The knee joint and ankle joint are normal.  There is degenerative change of the left knee joint.  Vascular clips noted.  IMPRESSION:  No acute osseous abnormality.  Original Report Authenticated By: Genevive Bi, M.D.   Dg Tibia/fibula Right  12/14/2011  *RADIOLOGY REPORT*  Clinical Data: Fall, leg pain  RIGHT TIBIA AND FIBULA - 2 VIEW  Comparison: In  Findings: No fracture of the right tibia or fibula.  No ankle joint and the knee joint are intact.  No joint effusion.  There is spurring of the patella.  IMPRESSION: No fracture of the right tibia or fibula.  Original Report Authenticated By: Genevive Bi, M.D.        MDM  Fall 5 days ago, ambulatory since that time. Continued bilateral hip pain, worse to right. PE findings symmetric. Imaging studies negative for fx or dislocation. There is no bariatric walker in ED to visualize pt ambulating, but I doubt occult fx given his ambulatory status at home combined with neg x-rays. As he  also complained of mild dysuria, a u/a was ordered but shows no infection.  Pt care discussed with EDP Ranae Palms, who assumes care at 8:30pm and will assess and determine pt readiness for disposition.        Shaaron Adler, PA-C 12/16/11 0030

## 2011-12-14 NOTE — ED Notes (Signed)
[id number]<YQ> Expected date:12/14/11<BR> Expected time:<BR> Means of arrival:<BR> Comments:<BR> EMS 10 GC - hip/leg pain

## 2011-12-14 NOTE — ED Notes (Signed)
[phone], Call [facility], wife, explain to her why he will not be admitted.

## 2011-12-14 NOTE — ED Notes (Signed)
IV at bedside to try for IV

## 2011-12-14 NOTE — ED Notes (Signed)
Iv started by IV team, unable to get any more blood at this time.

## 2011-12-14 NOTE — Discharge Instructions (Signed)
Your x-rays showed no broken bones. You should take your at-home pain medication as needed and follow-up with your doctor for re-evaluation if your pain continues. You can return to the ER if needed for any new or worsening symptoms.     Home Safety and Preventing Falls Falls are a leading cause of injury and while they affect all age groups, falls have greater short-term and long-term impact on older age groups. However, falls should not be a part of life or aging. It is possible for individuals and their families to use preventive measures to significantly decrease the likelihood that anyone, especially an older adult, will fall. There are many simple measures which can make your home safer with respect to preventing falls. The following actions can help reduce falls among all members of your family and are especially important as you age, when your balance, lower limb strength, coordination, and eyesight may be declining. The use of preventive measures will help to reduce you and your family's risk of falls and serious medical consequences. OUTDOORS  Repair cracks and edges of walkways and driveways.   Remove high doorway thresholds and trim shrubbery on the main path into your home.   Ensure there is good outside lighting at main entrances and along main walkways.   Clear walkways of tools, rocks, debris, and clutter.   Check that handrails are not broken and are securely fastened. Both sides of steps should have handrails.   In the garage, be attentive to and clean up grease or oil spills on the cement. This can make the surface extremely slippery.   In winter, have leaves, snow, and ice cleared regularly.   Use sand or salt on walkways during winter months.  BATHROOM  Install grab bars by the toilet and in the tub and shower.   Use non-skid mats or decals in the tub or shower.   If unable to easily stand unsupported while showering, place a plastic non slip stool in the shower to  sit on when needed.   Install night lights.   Keep floors dry and clean up all water on the floor immediately.   Remove soap buildup in tub or shower on a regular basis.   Secure bath mats with non-slip, double-sided rug tape.   Remove tripping hazards from the floors.  BEDROOMS  Install night lights.   Do not use oversized bedding.   Make sure a bedside light is easy to reach.   Keep a telephone by your bedside.   Make sure that you can get in and out of your bed easily.   Have a firm chair, with side arms, to use for getting dressed.   Remove clutter from around closets.   Store clothing, bed coverings, and other household items where you can reach them comfortably.   Remove tripping hazards from the floor.  LIVING AREAS AND STAIRWAYS  Turn on lights to avoid having to walk through dark areas.   Keep lighting uniform in each room. Place brighter lightbulbs in darker areas, including stairways.   Replace lightbulbs that burn out in stairways immediately.   Arrange furniture to provide for clear pathways.   Keep furniture in the same place.   Eliminate or tape down electrical cables in high traffic areas.   Place handrails on both sides of stairways. Use handrails when going up or down stairs.   Most falls occur on the top or bottom 3 steps.   Fix any loose handrails. Make sure handrails on  both sides of the stairways are as long as the stairs.   Remove all walkway obstacles.   Coil or tape electrical cords off to the side of walking areas and out of the way. If using many extension cords, have an electrician put in a new wall outlet to reduce or eliminate them.   Make sure spills are cleaned up quickly and allow time for drying before walking on freshly cleaned floors.   Firmly attach carpet with non-skid or two-sided tape.   Keep frequently used items within easy reach.   Remove tripping hazards such as throw rugs and clutter in walkways. Never leave  objects on stairs.   Get rid of throw rugs elsewhere if possible.   Eliminate uneven floor surfaces.   Make sure couches and chairs are easy to get into and out of.   Check carpeting to make sure it is firmly attached along stairs.   Make repairs to worn or loose carpet promptly.   Select a carpet pattern that does not visually hide the edge of steps.   Avoid placing throw rugs or scatter rugs at the top or bottom of stairways, or properly secure with carpet tape to prevent slippage.   Have an electrician put in a light switch at the top and bottom of the stairs.   Get light switches that glow.   Avoid the following practices: hurrying, inattention, obscured vision, carrying large loads, and wearing slip-on shoes.   Be aware of all pets.  KITCHEN  Place items that are used frequently, such as dishes and food, within easy reach.   Keep handles on pots and pans toward the center of the stove. Use back burners when possible.   Make sure spills are cleaned up quickly and allow time for drying.   Avoid walking on wet floors.   Avoid hot utensils and knives.   Position shelves so they are not too high or low.   Place commonly used objects within easy reach.   If necessary, use a sturdy step stool with a grab bar when reaching.   Make sure electrical cables are out of the way.   Do not use floor polish or wax that makes floors slippery.  OTHER HOME FALL PREVENTION STRATEGIES  Wear low heel or rubber sole shoes that are supportive and fit well.   Wear closed toe shoes.   Know and watch for side effects of medications. Have your caregiver or pharmacist look at all your medicines, even over-the-counter medicines. Some medicines can make you sleepy or dizzy.   Exercise regularly. Exercise makes you stronger and improves your balance and coordination.   Limit use of alcohol.   Use eyeglasses if necessary and keep them clean. Have your vision checked every year.    Organize your household in a manner that minimizes the need to walk distances when hurried, or go up and down stairs unnecessarily. For example, have a phone placed on at least each floor of your home. If possible, have a phone beside each sitting or lying area where you spend the most time at home. Keep emergency numbers posted at all phones.   Use non-skid floor wax.   When using a ladder, make sure:   The base is firm.   All ladder feet are on level ground.   The ladder is angled against the wall properly.   When climbing a ladder, face the ladder and hold the ladder rungs firmly.   If reaching, always keep your hips  and body weight centered between the rails.   When using a stepladder, make sure it is fully opened and both spreaders are firmly locked.   Do not climb a closed stepladder.   Avoid climbing beyond the second step from the top of a stepladder and the 4th rung from the top of an extension ladder.   Learn and use mobility aids as needed.   Change positions slowly. Arise slowly from sitting and lying positions. Sit on the edge of your bed before getting to your feet.   If you have a history of falls, ask someone to add color or contrast paint or tape to grab bars and handrails in your home.   If you have a history of falls, ask someone to place contrasting color strips on first and last steps.   Install an electrical emergency response system if you need one, and know how to use it.   If you have a medical or other condition that causes you to have limited physical strength, it is important that you reach out to family and friends for occasional help.  FOR CHILDREN:  If young children are in the home, use safety gates. At the top of stairs use screw-mounted gates; use pressure-mounted gates for the bottom of the stairs and doorways between rooms.   Young children should be taught to descend stairs on their stomachs, feet first, and later using the handrail.    Keep drawers fully closed to prevent them from being climbed on or pulled out entirely.   Move chairs, cribs, beds and other furniture away from windows.   Consider installing window guards on windows ground floor and up, unless they are emergency fire exits. Make sure they have easy release mechanisms.   Consider installing special locks that only allow the window to be opened to a certain height.   Never rely on window screens to prevent falls.   Never leave babies alone on changing tables, beds or sofas. Use a changing table that has a restraining strap.   When a child can pull to a standing position, the crib mattress should be adjusted to its lowest position. There should be at least 26 inches between the top rails of the crib drop side and the mattress. Toys, bumper pads, and other objects that can be used as steps to climb out should be removed from the crib.   On bunk beds never allow a child under age 37 to sleep on the top bunk. For older children, if the upper bunk is not against a wall, use guard rails on both sides. No matter how old a child is, keep the guard rails in place on the top bunk since children roll during sleep. Do not permit horseplay on bunks.   Grass and soil surfaces beneath backyard playground equipment should be replaced with hardwood chips, shredded wood mulch, sand, pea gravel, rubber, crushed stone, or another safer material at depths of at least 9 to 12 inches.   When riding bikes or using skates, skateboards, skis, or snowboards, require children to wear helmets. Look for those that have stickers stating that they meet or exceed safety standards.   Vertical posts or pickets in deck, balcony, and stairway railings should be no more than 3 1/2 inches apart if a young baby will have access to the area. The space between horizontal rails or bars, and between the floor and the first horizontal rail or bar, should be no more than 3 1/2 inches.  Document Released:  09/05/2002 Document Revised: 09/04/2011 Document Reviewed: 07/05/2009 [facility] Patient Information 2012 [place], [place].          Hip Pain The hips join the upper legs to the lower pelvis. The bones, cartilage, tendons, and muscles of the hip joint perform a lot of work each day holding your body weight and allowing you to move around. Hip pain is a common symptom. It can range from a minor ache to severe pain on 1 or both hips. Pain may be felt on the inside of the hip joint near the groin, or the outside near the buttocks and upper thigh. There may be swelling or stiffness as well. It occurs more often when a person walks or performs activity. There are many reasons hip pain can develop. CAUSES  It is important to work with your caregiver to identify the cause since many conditions can impact the bones, cartilage, muscles, and tendons of the hips. Causes for hip pain include:  Broken (fractured) bones.   Separation of the thighbone from the hip socket (dislocation).   Torn cartilage of the hip joint.   Swelling (inflammation) of a tendon (tendonitis), the sac within the hip joint (bursitis), or a joint.   A weakening in the abdominal wall (hernia), affecting the nerves to the hip.   Arthritis in the hip joint or lining of the hip joint.   Pinched nerves in the back, hip, or upper thigh.   A bulging disc in the spine (herniated disc).   Rarely, bone infection or cancer.  DIAGNOSIS  The location of your hip pain will help your caregiver understand what may be causing the pain. A diagnosis is based on your medical history, your symptoms, results from your physical exam, and results from diagnostic tests. Diagnostic tests may include X-ray exams, a computerized magnetic scan (magnetic resonance imaging, MRI), or bone scan. TREATMENT  Treatment will depend on the cause of your hip pain. Treatment may include:  Limiting activities and resting until symptoms improve.   Crutches or  other walking supports (a cane or brace).   Ice, elevation, and compression.   Physical therapy or home exercises.   Shoe inserts or special shoes.   Losing weight.   Medications to reduce pain.   Undergoing surgery.  HOME CARE INSTRUCTIONS   Only take over-the-counter or prescription medicines for pain, discomfort, or fever as directed by your caregiver.   Put ice on the injured area:   Put ice in a plastic bag.   Place a towel between your skin and the bag.   Leave the ice on for 15 to 20 minutes at a time, 3 to 4 times a day.   Keep your leg raised (elevated) when possible to lessen swelling.   Avoid activities that cause pain.   Follow specific exercises as directed by your caregiver.   Sleep with a pillow between your legs on your most comfortable side.   Record how often you have hip pain, the location of the pain, and what it feels like. This information may be helpful to you and your caregiver.   Ask your caregiver about returning to work or sports and whether you should drive.   Follow up with your caregiver for further exams, therapy, or testing as directed.  SEEK MEDICAL CARE IF:   Your pain or swelling continues or worsens after 1 week.   You are feeling unwell or have chills.   You have increasing difficulty with walking.   You  have a loss of sensation or other new symptoms.   You have questions or concerns.  SEEK IMMEDIATE MEDICAL CARE IF:   You cannot put weight on the affected hip.   You have fallen.   You have a sudden increase in pain and swelling in your hip.   You have a fever.  MAKE SURE YOU:   Understand these instructions.   Will watch your condition.   Will get help right away if you are not doing well or get worse.  Document Released: 03/05/2010 Document Revised: 09/04/2011 Document Reviewed: 03/05/2010 [facility] Patient Information 2012 [place], [place].

## 2011-12-15 ENCOUNTER — Encounter (HOSPITAL_COMMUNITY): Payer: Self-pay | Admitting: Family Medicine

## 2011-12-15 DIAGNOSIS — R5381 Other malaise: Secondary | ICD-10-CM | POA: Diagnosis present

## 2011-12-15 DIAGNOSIS — J9610 Chronic respiratory failure, unspecified whether with hypoxia or hypercapnia: Secondary | ICD-10-CM | POA: Diagnosis present

## 2011-12-15 DIAGNOSIS — R296 Repeated falls: Secondary | ICD-10-CM | POA: Diagnosis present

## 2011-12-15 DIAGNOSIS — I2510 Atherosclerotic heart disease of native coronary artery without angina pectoris: Secondary | ICD-10-CM | POA: Diagnosis present

## 2011-12-15 DIAGNOSIS — J209 Acute bronchitis, unspecified: Secondary | ICD-10-CM | POA: Diagnosis present

## 2011-12-15 DIAGNOSIS — N184 Chronic kidney disease, stage 4 (severe): Secondary | ICD-10-CM | POA: Diagnosis present

## 2011-12-15 DIAGNOSIS — J189 Pneumonia, unspecified organism: Secondary | ICD-10-CM | POA: Diagnosis present

## 2011-12-15 DIAGNOSIS — W19XXXA Unspecified fall, initial encounter: Secondary | ICD-10-CM | POA: Diagnosis present

## 2011-12-15 DIAGNOSIS — G4733 Obstructive sleep apnea (adult) (pediatric): Secondary | ICD-10-CM | POA: Diagnosis present

## 2011-12-15 LAB — GLUCOSE, CAPILLARY
Glucose-Capillary: 100 mg/dL — ABNORMAL HIGH (ref 70–99)
Glucose-Capillary: 179 mg/dL — ABNORMAL HIGH (ref 70–99)
Glucose-Capillary: 207 mg/dL — ABNORMAL HIGH (ref 70–99)

## 2011-12-15 LAB — BASIC METABOLIC PANEL
CO2: 30 mEq/L (ref 19–32)
Calcium: 9.2 mg/dL (ref 8.4–10.5)
Creatinine, Ser: 1.31 mg/dL (ref 0.50–1.35)

## 2011-12-15 LAB — PRO B NATRIURETIC PEPTIDE: Pro B Natriuretic peptide (BNP): 288.9 pg/mL — ABNORMAL HIGH (ref 0–125)

## 2011-12-15 LAB — CBC
MCHC: 31 g/dL (ref 30.0–36.0)
MCV: 75.7 fL — ABNORMAL LOW (ref 78.0–100.0)
Platelets: 204 10*3/uL (ref 150–400)
RDW: 16.3 % — ABNORMAL HIGH (ref 11.5–15.5)
WBC: 5.9 10*3/uL (ref 4.0–10.5)

## 2011-12-15 MED ORDER — ISOSORBIDE MONONITRATE ER 60 MG PO TB24
60.0000 mg | ORAL_TABLET | Freq: Every day | ORAL | Status: DC
  Administered 2011-12-15 – 2011-12-22 (×8): 60 mg via ORAL
  Filled 2011-12-15 (×9): qty 1

## 2011-12-15 MED ORDER — OXYCODONE-ACETAMINOPHEN 5-325 MG PO TABS
1.0000 | ORAL_TABLET | Freq: Three times a day (TID) | ORAL | Status: DC | PRN
  Administered 2011-12-20: 1 via ORAL
  Filled 2011-12-15: qty 1

## 2011-12-15 MED ORDER — ALBUTEROL SULFATE (5 MG/ML) 0.5% IN NEBU: 2.5000 mg | INHALATION_SOLUTION | RESPIRATORY_TRACT | Status: DC | PRN

## 2011-12-15 MED ORDER — PANTOPRAZOLE SODIUM 40 MG PO TBEC
40.0000 mg | DELAYED_RELEASE_TABLET | Freq: Every day | ORAL | Status: DC
  Administered 2011-12-15 – 2011-12-22 (×8): 40 mg via ORAL
  Filled 2011-12-15 (×9): qty 1

## 2011-12-15 MED ORDER — POLYETHYL GLYCOL-PROPYL GLYCOL 0.4-0.3 % OP SOLN: 1.0000 [drp] | Freq: Every day | OPHTHALMIC | Status: DC

## 2011-12-15 MED ORDER — BIOTENE DRY MOUTH MT LIQD
15.0000 mL | Freq: Two times a day (BID) | OROMUCOSAL | Status: DC
  Administered 2011-12-15 – 2011-12-22 (×15): 15 mL via OROMUCOSAL

## 2011-12-15 MED ORDER — IPRATROPIUM BROMIDE 0.02 % IN SOLN: 0.5000 mg | RESPIRATORY_TRACT | Status: DC | PRN

## 2011-12-15 MED ORDER — HYDROCODONE-ACETAMINOPHEN 5-325 MG PO TABS
1.0000 | ORAL_TABLET | ORAL | Status: DC | PRN
  Administered 2011-12-15: 1 via ORAL
  Administered 2011-12-15: 2 via ORAL
  Administered 2011-12-17: 1 via ORAL
  Administered 2011-12-17: 2 via ORAL
  Filled 2011-12-15: qty 2
  Filled 2011-12-15: qty 1
  Filled 2011-12-15: qty 2
  Filled 2011-12-15: qty 1

## 2011-12-15 MED ORDER — ENOXAPARIN SODIUM 40 MG/0.4ML ~~LOC~~ SOLN
70.0000 mg | SUBCUTANEOUS | Status: DC
  Filled 2011-12-15: qty 0.3

## 2011-12-15 MED ORDER — OXYCODONE-ACETAMINOPHEN 10-325 MG PO TABS: 1.0000 | ORAL_TABLET | Freq: Three times a day (TID) | ORAL | Status: DC | PRN

## 2011-12-15 MED ORDER — ACETAMINOPHEN 650 MG RE SUPP: 650.0000 mg | Freq: Four times a day (QID) | RECTAL | Status: DC | PRN

## 2011-12-15 MED ORDER — LEVOFLOXACIN IN D5W 750 MG/150ML IV SOLN
750.0000 mg | INTRAVENOUS | Status: DC
  Administered 2011-12-15: 750 mg via INTRAVENOUS
  Filled 2011-12-15: qty 150

## 2011-12-15 MED ORDER — BENZONATATE 100 MG PO CAPS
100.0000 mg | ORAL_CAPSULE | Freq: Three times a day (TID) | ORAL | Status: DC | PRN
  Filled 2011-12-15: qty 1

## 2011-12-15 MED ORDER — ENOXAPARIN SODIUM 80 MG/0.8ML ~~LOC~~ SOLN
70.0000 mg | SUBCUTANEOUS | Status: DC
  Administered 2011-12-15 – 2011-12-21 (×7): 70 mg via SUBCUTANEOUS
  Filled 2011-12-15 (×8): qty 0.8

## 2011-12-15 MED ORDER — FLUOXETINE HCL 20 MG PO TABS: 20.0000 mg | ORAL_TABLET | Freq: Every day | ORAL | Status: DC

## 2011-12-15 MED ORDER — SIMVASTATIN 20 MG PO TABS
20.0000 mg | ORAL_TABLET | Freq: Every day | ORAL | Status: DC
  Administered 2011-12-15 – 2011-12-21 (×7): 20 mg via ORAL
  Filled 2011-12-15 (×9): qty 1

## 2011-12-15 MED ORDER — LEVOFLOXACIN IN D5W 750 MG/150ML IV SOLN
750.0000 mg | INTRAVENOUS | Status: DC
  Administered 2011-12-16 – 2011-12-17 (×2): 750 mg via INTRAVENOUS
  Filled 2011-12-15 (×3): qty 150

## 2011-12-15 MED ORDER — BENAZEPRIL HCL 20 MG PO TABS
20.0000 mg | ORAL_TABLET | Freq: Two times a day (BID) | ORAL | Status: DC
  Administered 2011-12-15 – 2011-12-21 (×13): 20 mg via ORAL
  Filled 2011-12-15 (×15): qty 1

## 2011-12-15 MED ORDER — TAMSULOSIN HCL 0.4 MG PO CAPS
0.4000 mg | ORAL_CAPSULE | Freq: Every day | ORAL | Status: DC
  Administered 2011-12-15 – 2011-12-22 (×8): 0.4 mg via ORAL
  Filled 2011-12-15 (×9): qty 1

## 2011-12-15 MED ORDER — FLUOXETINE HCL 20 MG PO CAPS
20.0000 mg | ORAL_CAPSULE | Freq: Every day | ORAL | Status: DC
  Administered 2011-12-15 – 2011-12-22 (×8): 20 mg via ORAL
  Filled 2011-12-15 (×9): qty 1

## 2011-12-15 MED ORDER — INSULIN ASPART 100 UNIT/ML ~~LOC~~ SOLN
0.0000 [IU] | Freq: Every day | SUBCUTANEOUS | Status: DC
  Administered 2011-12-15 – 2011-12-19 (×5): 2 [IU] via SUBCUTANEOUS
  Administered 2011-12-20: 3 [IU] via SUBCUTANEOUS
  Administered 2011-12-21: 4 [IU] via SUBCUTANEOUS

## 2011-12-15 MED ORDER — MORPHINE SULFATE 2 MG/ML IJ SOLN: 2.0000 mg | INTRAMUSCULAR | Status: DC | PRN

## 2011-12-15 MED ORDER — INSULIN ASPART 100 UNIT/ML ~~LOC~~ SOLN
0.0000 [IU] | Freq: Three times a day (TID) | SUBCUTANEOUS | Status: DC
  Administered 2011-12-15 (×2): 3 [IU] via SUBCUTANEOUS
  Administered 2011-12-16: 5 [IU] via SUBCUTANEOUS
  Administered 2011-12-16 (×2): 3 [IU] via SUBCUTANEOUS
  Administered 2011-12-17: 5 [IU] via SUBCUTANEOUS
  Administered 2011-12-17 – 2011-12-18 (×3): 8 [IU] via SUBCUTANEOUS
  Administered 2011-12-18 – 2011-12-19 (×5): 5 [IU] via SUBCUTANEOUS
  Administered 2011-12-20 (×2): 8 [IU] via SUBCUTANEOUS
  Administered 2011-12-20: 11 [IU] via SUBCUTANEOUS
  Administered 2011-12-21 (×2): 5 [IU] via SUBCUTANEOUS
  Administered 2011-12-21: 8 [IU] via SUBCUTANEOUS
  Administered 2011-12-22: 5 [IU] via SUBCUTANEOUS
  Administered 2011-12-22: 8 [IU] via SUBCUTANEOUS

## 2011-12-15 MED ORDER — ACETAMINOPHEN 325 MG PO TABS: 650.0000 mg | ORAL_TABLET | Freq: Four times a day (QID) | ORAL | Status: DC | PRN

## 2011-12-15 MED ORDER — SODIUM CHLORIDE 0.9 % IJ SOLN: 3.0000 mL | INTRAMUSCULAR | Status: DC | PRN

## 2011-12-15 MED ORDER — GABAPENTIN 300 MG PO CAPS
600.0000 mg | ORAL_CAPSULE | Freq: Four times a day (QID) | ORAL | Status: DC
  Administered 2011-12-15 – 2011-12-20 (×24): 600 mg via ORAL
  Administered 2011-12-21: 300 mg via ORAL
  Administered 2011-12-21 – 2011-12-22 (×5): 600 mg via ORAL
  Filled 2011-12-15 (×36): qty 2

## 2011-12-15 MED ORDER — ENOXAPARIN SODIUM 40 MG/0.4ML ~~LOC~~ SOLN
40.0000 mg | SUBCUTANEOUS | Status: DC
  Filled 2011-12-15: qty 0.4

## 2011-12-15 MED ORDER — OXYCODONE HCL 5 MG PO TABS
5.0000 mg | ORAL_TABLET | Freq: Three times a day (TID) | ORAL | Status: DC | PRN
  Administered 2011-12-20: 5 mg via ORAL
  Filled 2011-12-15: qty 1

## 2011-12-15 MED ORDER — CHLORHEXIDINE GLUCONATE 0.12 % MT SOLN
15.0000 mL | Freq: Two times a day (BID) | OROMUCOSAL | Status: DC
  Administered 2011-12-15 – 2011-12-22 (×14): 15 mL via OROMUCOSAL
  Filled 2011-12-15 (×18): qty 15

## 2011-12-15 MED ORDER — POLYVINYL ALCOHOL 1.4 % OP SOLN
1.0000 [drp] | Freq: Every day | OPHTHALMIC | Status: DC
  Administered 2011-12-15 – 2011-12-22 (×8): 1 [drp] via OPHTHALMIC
  Filled 2011-12-15: qty 15

## 2011-12-15 MED ORDER — GLIMEPIRIDE 2 MG PO TABS
2.0000 mg | ORAL_TABLET | Freq: Every day | ORAL | Status: DC
  Administered 2011-12-15 – 2011-12-18 (×4): 2 mg via ORAL
  Filled 2011-12-15 (×5): qty 1

## 2011-12-15 NOTE — Progress Notes (Signed)
0430-Pt refused CPAP at this time, RT to monitor and assess as needed

## 2011-12-15 NOTE — Progress Notes (Addendum)
ANTIBIOTIC CONSULT NOTE - INITIAL  Pharmacy Consult for Levaquin  Indication: pneumonia  No Known Allergies  Patient Measurements:  Ht: Wt:   Vital Signs: Temp: 97.9 F (36.6 C) (03/18 0050) Temp src: Oral (03/18 0050) BP: 138/71 mmHg (03/18 0050) Pulse Rate: 78  (03/18 0050) Intake/Output from previous day:   Intake/Output from this shift:    Labs:  Basename 12/14/11 2200  WBC 6.7  HGB 11.3*  PLT 196  LABCREA --  CREATININE 1.23   CrCl is unknown because there is no height on file for the current visit. No results found for this basename: VANCOTROUGH:2,VANCOPEAK:2,VANCORANDOM:2,GENTTROUGH:2,GENTPEAK:2,GENTRANDOM:2,TOBRATROUGH:2,TOBRAPEAK:2,TOBRARND:2,AMIKACINPEAK:2,AMIKACINTROU:2,AMIKACIN:2, in the last 72 hours   Microbiology: No results found for this or any previous visit (from the past 720 hour(s)).  Medical History: Past Medical History  Diagnosis Date  . Coronary artery disease   . Diabetes mellitus   . CHF (congestive heart failure)   . Renal disorder   . Arthritis   . COPD (chronic obstructive pulmonary disease)   . Chronic respiratory failure     Medications:  Scheduled:    . benazepril  20 mg Oral BID  . enoxaparin  40 mg Subcutaneous Q24H  . FLUoxetine  20 mg Oral Daily  . gabapentin  600 mg Oral QID  . glimepiride  2 mg Oral QAC breakfast  . insulin aspart  0-15 Units Subcutaneous TID WC  . insulin aspart  0-5 Units Subcutaneous QHS  . isosorbide mononitrate  60 mg Oral Daily  . pantoprazole  40 mg Oral Q1200  . polyvinyl alcohol  1 drop Left Eye Daily  . simvastatin  20 mg Oral QHS  . Tamsulosin HCl  0.4 mg Oral Daily  . DISCONTD: FLUoxetine  20 mg Oral Daily  . DISCONTD: levofloxacin (LEVAQUIN) IV  750 mg Intravenous Q24H  . DISCONTD: Polyethyl Glycol-Propyl Glycol  1 drop Left Eye Daily   Infusions:   Assessment: 71 yo admitted with CAP.  Goal of Therapy:  Treat infection  Plan:   Levaquin 750mg  IV q24h.  CrCl~55  (N)  F/U SCr as needed.  Adjust Lovenox for BMI = 42- Lovenox 70mg  SQ q24h.  Susanne Greenhouse R 12/15/2011,2:21 AM

## 2011-12-15 NOTE — Progress Notes (Signed)
Patient ID: [name], male   DOB: December 05, 1940, 71 y.o.   MRN: [id number]  Assessment/Plan:  Principal Problem:   *Community acquired pneumonia - continue Levaquin - keep O2 saturation above 90% - nebulizer treatments as needed for shortness of breath  Active Problems:   Falls - x rays negative for fractures - PT evaluation - recommends SNF   Chronic respiratory failure - likely related to history of OSA/ ? Obesity hypoventilation syndrome - stable at this time   Obesity, morbid (more than 100 lbs over ideal weight or BMI > 40) - nutrition consult   CKD (chronic kidney disease) stage 3, GFR 30-59 ml/min - stable on admission; Cr = 1.31 - continue to monitor   COPD (chronic obstructive pulmonary disease) - nebulizer treatments  as needed for shortness of breath   EDUCATION - test results and diagnostic studies were discussed with patient  at the bedside - patient has verbalized the understanding - questions were answered at the bedside and contact information was provided for additional questions or concerns   Subjective:  No events overnight. Patient denies chest pain, shortness of breath or abdominal pain.   Objective:  Vital signs in last 24 hours:   12/15/11 0300 12/15/11 0542 12/15/11 1420  BP: 153/84 137/77 123/82  Pulse: 84 72 76  Temp: 98 F (36.7 C) 97.9 F (36.6 C) 97.9 F (36.6 C)  TempSrc: Oral Oral Oral  Resp: 18 18 16   Weight: 145 kg (319 lb 10.7 oz)    SpO2: 98% 98% 97%    Intake/Output from previous day:  Intake/Output Summary (Last 24 hours) at 12/15/11 1651 Last data filed at 12/15/11 1500  Gross per 24 hour  Intake    720 ml  Output   1460 ml  Net   -740 ml    Physical Exam: General:  in no acute distress. HEENT: No bruits, no goiter. Dry mucous membranes, no scleral icterus, no conjunctival pallor. Heart: Regular rate and rhythm, S1/S2 +, no murmurs, rubs, gallops. Lungs: coarse breath sounds at mid lung lobes Abdomen: Soft,  nontender, nondistended, positive bowel sounds. Extremities: No clubbing or cyanosis, no pitting edema,  positive pedal pulses. Neuro: Grossly nonfocal.  Lab Results:  Lab 12/15/11 0355 12/14/11 2200  WBC 5.9 6.7  HGB 10.5* 11.3*  HCT 33.9* 36.7*  PLT 204 196  MCV 75.7* 75.7*    Lab 12/15/11 0355 12/14/11 2200  NA 138 138  K 4.1 4.7  CL 102 104  CO2 30 26  GLUCOSE 110* 53*  BUN 16 16  CREATININE 1.31 1.23  CALCIUM 9.2 9.3    Lab 12/14/11 2200  TROPONINI <0.30    Studies/Results: Dg Pelvis 1-2 Views 12/14/2011    IMPRESSION: Suboptimal study as described above.  No gross fracture or subluxation.   Dg Tibia/fibula Left 12/14/2011    IMPRESSION:  No acute osseous abnormality.    Dg Tibia/fibula Right 12/14/2011    IMPRESSION: No fracture of the right tibia or fibula.    Dg Chest Port 1 View 12/14/2011   IMPRESSION: Mild bibasilar airspace opacities raise concern for mild pneumonia.     Medications: Scheduled Meds:   . benazepril  20 mg Oral BID  . enoxaparin (LOVENOX) injection  70 mg Subcutaneous Q24H  . FLUoxetine  20 mg Oral Daily  . gabapentin  600 mg Oral QID  . glimepiride  2 mg Oral QAC breakfast  . insulin aspart  0-15 Units Subcutaneous TID WC  . insulin aspart  0-5 Units Subcutaneous QHS  . isosorbide mononitrate  60 mg Oral Daily  . levofloxacin (LEVAQUIN) IV  750 mg Intravenous Q24H  . pantoprazole  40 mg Oral Q1200  . simvastatin  20 mg Oral QHS  . Tamsulosin HCl  0.4 mg Oral Daily   Continuous Infusions:  PRN Meds:.acetaminophen, acetaminophen, albuterol, benzonatate, HYDROcodone-acetaminophen, ipratropium, morphine, oxyCODONE, oxyCODONE-acetaminophen, sodium chloride, DISCONTD: oxyCODONE-acetaminophen   LOS: 1 day   [name] 12/15/2011, 4:51 PM  [facility] Pager: [phone]

## 2011-12-15 NOTE — H&P (Signed)
PCP:   Dr. Nickolas Madrid   Chief Complaint:  Falls and inability to walk  HPI: This is a 71 year old gentleman, who is morbidly obese and chronically deconditioned. He states he has fallen 3 times this month, requiring a call to the fire station to assist him up. 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 This Wednesday the patient again fell, he states his right knee gave out. His wife heard a pop. He was unable to get up, again the [facility] was called, they came and assist the patient into bed. The patient usually uses a walker. Since this fall, he states he's not been able to get around. He walks 15 feet, feels a burning in his right leg and has to sit down. He has not fallen again. Additionally, he has chronic respiratory failure and COPD. He's been having increasing shortness of breath and wheezing for the past few days. He reports a nonproductive cough and chills.  The patient has been to rehabilitation 3 times recently for deconditioning. Most recently was approximately 8 months ago. History provided by the patient is alert and oriented.  Review of Systems: Positives bolded    anorexia, fever, weight loss,, vision loss, decreased hearing, hoarseness, chest pain, syncope, dyspnea on exertion, peripheral edema, balance deficits, hemoptysis, abdominal pain, melena, hematochezia, severe indigestion/heartburn, hematuria, incontinence, genital sores, muscle weakness, suspicious skin lesions, transient blindness, difficulty walking, depression, unusual weight change, abnormal bleeding, enlarged lymph nodes, angioedema, and breast masses.  Past Medical History: Past Medical History  Diagnosis Date  . Coronary artery disease   . Diabetes mellitus   . CHF (congestive heart failure)   . Renal disorder   . Arthritis   . COPD (chronic obstructive pulmonary disease)   . Chronic respiratory failure    Past Surgical History  Procedure Date  . Back surgery    . Coronary artery bypass graft   . Cholecystectomy   . Tonsillectomy   . Cataract extraction     Medications: Prior to Admission medications   Medication Sig Start Date End Date Taking? Authorizing Provider  benazepril (LOTENSIN) 20 MG tablet Take 20 mg by mouth 2 (two) times daily.   Yes Historical Provider, MD  clotrimazole-betamethasone (LOTRISONE) cream Apply 1 application topically 2 (two) times daily. Applies to affected area on legs.   Yes Historical Provider, MD  FLUoxetine (PROZAC) 20 MG tablet Take 20 mg by mouth daily.   Yes Historical Provider, MD  gabapentin (NEURONTIN) 600 MG tablet Take 600 mg by mouth 4 (four) times daily.   Yes Historical Provider, MD  glimepiride (AMARYL) 2 MG tablet Take 2 mg by mouth daily before breakfast.   Yes Historical Provider, MD  isosorbide mononitrate (IMDUR) 60 MG 24 hr tablet Take 60 mg by mouth daily.   Yes Historical Provider, MD  omeprazole (PRILOSEC) 40 MG capsule Take 40 mg by mouth daily.   Yes Historical Provider, MD  oxyCODONE-acetaminophen (PERCOCET) 10-325 MG per tablet Take 1 tablet by mouth every 8 (eight) hours as needed. For pain.   Yes Historical Provider, MD  Polyethyl Glycol-Propyl Glycol (SYSTANE) 0.4-0.3 % SOLN Place 1 drop into the left eye daily.    Yes Historical Provider, MD  simvastatin (ZOCOR) 20 MG tablet Take 20 mg by mouth at bedtime.   Yes Historical Provider, MD  Tamsulosin HCl (FLOMAX) 0.4 MG CAPS Take by mouth daily. One capsule 30 mins after the same meal each day   Yes  Historical Provider, MD  traMADol (ULTRAM) 50 MG tablet Take 1 tablet (50 mg total) by mouth every 6 (six) hours as needed for pain. 12/14/11 12/24/11  Loren Racer, MD    Allergies:  No Known Allergies  Social History:  reports that he has quit smoking. He does not have any smokeless tobacco history on file. He reports that he does not drink alcohol or use illicit drugs. lives at home with wife, uses nocturnal CPAP, on home oxygen, uses a  walker  Family History: Family History  Problem Relation Age of Onset  . Cancer      Physical Exam: Filed Vitals:   12/14/11 1511 12/14/11 2026  BP: 153/126 131/61  Pulse: 84 72  Temp: 99.2 F (37.3 C) 98.1 F (36.7 C)  TempSrc:  Oral  Resp: 16 20  SpO2: 92% 92%    General:  Alert and oriented times three, morbidly obese, no acute distress Eyes: PERRLA, pink conjunctiva, no scleral icterus ENT: Moist oral mucosa, neck supple, no thyromegaly Lungs: clear to ascultation, no wheeze, no crackles, no use of accessory muscles Cardiovascular: regular rate and rhythm, no regurgitation, no gallops, no murmurs. No carotid bruits, no JVD Abdomen: soft, positive BS, non-tender, non-distended, no organomegaly, not an acute abdomen GU: not examined Neuro: CN II - XII grossly intact, sensation intact Musculoskeletal: strength 4/5 bilateral lower extremities, no clubbing, cyanosis or edema Skin: no rash, no subcutaneous crepitation, no decubitus Psych: appropriate patient   Labs on Admission:   Basename 12/14/11 2200  NA 138  K 4.7  CL 104  CO2 26  GLUCOSE 53*  BUN 16  CREATININE 1.23  CALCIUM 9.3  MG --  PHOS --   No results found for this basename: AST:2,ALT:2,ALKPHOS:2,BILITOT:2,PROT:2,ALBUMIN:2 in the last 72 hours No results found for this basename: LIPASE:2,AMYLASE:2 in the last 72 hours  Basename 12/14/11 2200  WBC 6.7  NEUTROABS 3.0  HGB 11.3*  HCT 36.7*  MCV 75.7*  PLT 196    Basename 12/14/11 2200  CKTOTAL --  CKMB --  CKMBINDEX --  TROPONINI <0.30   No components found with this basename: POCBNP:3 No results found for this basename: DDIMER:2 in the last 72 hours No results found for this basename: HGBA1C:2 in the last 72 hours No results found for this basename: CHOL:2,HDL:2,LDLCALC:2,TRIG:2,CHOLHDL:2,LDLDIRECT:2 in the last 72 hours No results found for this basename: TSH,T4TOTAL,FREET3,T3FREE,THYROIDAB in the last 72 hours No results found for this  basename: VITAMINB12:2,FOLATE:2,FERRITIN:2,TIBC:2,IRON:2,RETICCTPCT:2 in the last 72 hours  Micro Results: No results found for this or any previous visit (from the past 240 hour(s)). Results for KRISTOF, NADEEM (MRN [id number]) as of 12/15/2011 00:46  Ref. Range 12/14/2011 16:29  Color, Urine Latest Range: YELLOW  YELLOW  APPearance Latest Range: CLEAR  CLEAR  Specific Gravity, Urine Latest Range: 1.005-1.030  1.017  pH Latest Range: 5.0-8.0  6.0  Glucose, UA Latest Range: NEGATIVE mg/dL NEGATIVE  Bilirubin Urine Latest Range: NEGATIVE  NEGATIVE  Ketones, ur Latest Range: NEGATIVE mg/dL NEGATIVE  Protein Latest Range: NEGATIVE mg/dL NEGATIVE  Urobilinogen, UA Latest Range: 0.0-1.0 mg/dL 1.0  Nitrite Latest Range: NEGATIVE  NEGATIVE  Leukocytes, UA Latest Range: NEGATIVE  NEGATIVE  Hgb urine dipstick Latest Range: NEGATIVE  NEGATIVE    Radiological Exams on Admission: Dg Pelvis 1-2 Views  12/14/2011  *RADIOLOGY REPORT*  Clinical Data: Fall, hip pain  PELVIS - 1-2 VIEW  Comparison: 12/03/2010  Findings: The study is markedly limited due to the patient's large body habitus.  A single  frontal view of the pelvis shows no gross fracture or subluxation.  IMPRESSION: Suboptimal study as described above.  No gross fracture or subluxation.  Original Report Authenticated By: Natasha Mead, M.D.   Dg Tibia/fibula Left  12/14/2011  *RADIOLOGY REPORT*  Clinical Data: Leg pain  LEFT TIBIA AND FIBULA - 2 VIEW  Comparison: None.  Findings: No fracture or dislocation of the left tibia-fibula.  The knee joint and ankle joint are normal.  There is degenerative change of the left knee joint.  Vascular clips noted.  IMPRESSION:  No acute osseous abnormality.  Original Report Authenticated By: Genevive Bi, M.D.   Dg Tibia/fibula Right  12/14/2011  *RADIOLOGY REPORT*  Clinical Data: Fall, leg pain  RIGHT TIBIA AND FIBULA - 2 VIEW  Comparison: In  Findings: No fracture of the right tibia or fibula.  No ankle joint  and the knee joint are intact.  No joint effusion.  There is spurring of the patella.  IMPRESSION: No fracture of the right tibia or fibula.  Original Report Authenticated By: Genevive Bi, M.D.   Dg Chest Port 1 View  12/14/2011  *RADIOLOGY REPORT*  Clinical Data: Shortness of breath and chest pain for 3 days.  PORTABLE CHEST - 1 VIEW  Comparison: Chest radiograph performed 07/26/2009  Findings: The lungs are relatively well-aerated.  Mild bibasilar airspace opacities raise question for mild pneumonia.  There is no evidence of pleural effusion or pneumothorax.  The cardiomediastinal silhouette is normal in size; the patient is status post median sternotomy.  There are fractures of the visualized sternal wires.  No acute osseous abnormalities are seen.  IMPRESSION: Mild bibasilar airspace opacities raise concern for mild pneumonia.  Original Report Authenticated By: Tonia Ghent, M.D.    Assessment/Plan Present on Admission:  .Community acquired pneumonia COPD with mild to bronchitis exacerbation Chronic respiratory failure  Admit to MedSurg  Sputum cultures ordered IV antibiotics, duo nebs, oxygen, Tessalon Perles ordered  .Falls Physical deconditioned Morbid obesity BMI greater than 40 Physical therapy consult Social service consult for question of placement to a nursing home for rehabilitation Obstructive sleep apnea-on CPAP CPAP ordered Diabetes mellitus Coronary artery disease Stable resume home medication   DO NOT INTUBATE DVT prophylaxis Team 8/Dr. Nelia Shi, Venson Ferencz 12/15/2011, 12:42 AM

## 2011-12-15 NOTE — Evaluation (Signed)
Physical Therapy Evaluation Patient Details Name: [name] MRN: [id number] DOB: 1941-04-21 Today's Date: 12/15/2011  Problem List:  Patient Active Problem List  Diagnoses  . Community acquired pneumonia  . Falls  . Physical deconditioning  . Chronic respiratory failure  . Obesity, morbid (more than 100 lbs over ideal weight or BMI > 40)  . Coronary artery disease  . Chronic kidney disease  . COPD (chronic obstructive pulmonary disease) with acute bronchitis  . Obstructive sleep apnea    Past Medical History:  Past Medical History  Diagnosis Date  . Coronary artery disease   . Diabetes mellitus   . CHF (congestive heart failure)   . Renal disorder   . Arthritis   . COPD (chronic obstructive pulmonary disease)   . Chronic respiratory failure    Past Surgical History:  Past Surgical History  Procedure Date  . Back surgery   . Coronary artery bypass graft   . Cholecystectomy   . Tonsillectomy   . Cataract extraction     PT Assessment/Plan/Recommendation PT Assessment Clinical Impression Statement: Pt presents with community aquired PNA with decreased mobility.  Pt states that he was having many falls at home due to right knee giving out on him.  Tolerated bed to chair transfer with some steps, however presented with right lateral lean with verbal and manual cuing for left weight shift.  Pt also required constant cuing for stepping with LLE.  He states that wife usually takes care of him, however she is sick and will not be able to care for him.  Discussed short term SNF stay to decrease fall risk and decrease burden of care.  Pt states that he will "think about it."   PT Recommendation/Assessment: Patient will need skilled PT in the acute care venue PT Problem List: Decreased strength;Decreased activity tolerance;Decreased balance;Decreased mobility;Decreased coordination;Decreased safety awareness;Decreased knowledge of precautions;Obesity;Cardiopulmonary status limiting  activity;Decreased knowledge of use of DME Barriers to Discharge: Decreased caregiver support PT Therapy Diagnosis : Difficulty walking;Abnormality of gait;Generalized weakness PT Plan PT Frequency: Min 3X/week PT Treatment/Interventions: DME instruction;Gait training;Functional mobility training;Therapeutic activities;Therapeutic exercise;Patient/family education PT Recommendation Recommendations for Other Services: OT consult Follow Up Recommendations: Skilled nursing facility Equipment Recommended: Defer to next venue PT Goals  Acute Rehab PT Goals PT Goal Formulation: With patient Time For Goal Achievement: 2 weeks Pt will go Supine/Side to Sit: with supervision PT Goal: Supine/Side to Sit - Progress: Goal set today Pt will go Sit to Supine/Side: with supervision PT Goal: Sit to Supine/Side - Progress: Goal set today Pt will go Sit to Stand: with supervision PT Goal: Sit to Stand - Progress: Goal set today Pt will go Stand to Sit: with supervision PT Goal: Stand to Sit - Progress: Goal set today Pt will Transfer Bed to Chair/Chair to Bed: with supervision PT Transfer Goal: Bed to Chair/Chair to Bed - Progress: Goal set today Pt will Ambulate: 1 - 15 feet;with min assist;with least restrictive assistive device PT Goal: Ambulate - Progress: Goal set today  PT Evaluation Precautions/Restrictions  Precautions Precautions: Fall Required Braces or Orthoses: No Restrictions Weight Bearing Restrictions: No Prior Functioning  Home Living Lives With: Significant other Receives Help From: Family Type of Home: Apartment Home Layout: One level Home Access: Level entry Home Adaptive Equipment: Walker - rolling Prior Function Level of Independence: Requires assistive device for independence;Needs assistance with ADLs;Needs assistance with homemaking Comments: Pt states that he was getting help from wife, however wife is sick and unable to provide appropriate  care for him at this  point.   Cognition Cognition Arousal/Alertness: Awake/alert Overall Cognitive Status: Appears within functional limits for tasks assessed Orientation Level: Oriented X4 Sensation/Coordination Sensation Light Touch: Appears Intact Coordination Gross Motor Movements are Fluid and Coordinated: Yes Extremity Assessment RLE Assessment RLE Assessment: Exceptions to [facility] RLE Strength RLE Overall Strength Comments: Grossly WFL per functional assessment.  LLE Assessment LLE Assessment: Exceptions to [facility] LLE Strength LLE Overall Strength Comments: Grossly WFL per functional assessment Mobility (including Balance) Bed Mobility Bed Mobility: Yes Supine to Sit: 1: +2 Total assist;Patient percentage (comment);HOB elevated (Comment degrees) Supine to Sit Details (indicate cue type and reason): Pt assist 75%. Provided some assist for LE with cues for UE technique to self assist trunk into sitting.  Noted pt with posterior lean, with pt stating that he feels like he is upright.  Transfers Transfers: Yes Sit to Stand: 1: +2 Total assist;Patient percentage (comment);From elevated surface;With upper extremity assist;From bed Sit to Stand Details (indicate cue type and reason): Pt assist 50%.  Provided assist for forward weight shift with cues for hand placement and feet placement on the ground prior to standing.  Stand to Sit: 1: +2 Total assist;Patient percentage (comment);With upper extremity assist;With armrests;To chair/3-in-1 Stand to Sit Details: Pt assist 65%.  Assist for controlled descent with cues for hand placement on arm rests.  Stand Pivot Transfers: 1: +2 Total assist;Patient percentage (comment) Stand Pivot Transfer Details (indicate cue type and reason): Pt assist 40% with max cuing for weight shift and stepping technique with RW.  Pt with tendency to lean to right side, however pt states that the right knee is the "bad knee."   Ambulation/Gait Ambulation/Gait: No (Deferred due to pt  stating his knee gives out on him)  Posture/Postural Control Posture/Postural Control: Postural limitations Postural Limitations: Pt with right lateral lean in standing.  Provided verbal and manual cuing for left weight shift.  Balance Balance Assessed: No Exercise    End of Session PT - End of Session Equipment Utilized During Treatment: Gait belt Activity Tolerance: Patient limited by pain;Patient limited by fatigue Patient left: in chair;with call bell in reach Nurse Communication: Mobility status for transfers;Mobility status for ambulation General Behavior During Session: [facility] for tasks performed Cognition: [facility] for tasks performed  Page, Meribeth Mattes 12/15/2011, 12:16 PM

## 2011-12-15 NOTE — Progress Notes (Signed)
Nutrition Brief Note  - Pt screened for problems chewing r/t pt is edentulous, however met with pt who denies any problems chewing his food. Pt reports eating well PTA with stable weight and has consumed 100% of meals this admission. No nutrition diagnosis at this time. Will continue to monitor intake.   Dietitian # [phone]

## 2011-12-15 NOTE — Plan of Care (Signed)
Problem: Phase I Progression Outcomes Goal: Code status addressed with pt/family Outcome: Completed/Met Date Met:  12/15/11 PAR Goal: Initial discharge plan identified Outcome: Progressing Wife states she is unable to care for her husband at home Goal: Voiding-avoid urinary catheter unless indicated Outcome: Completed/Met Date Met:  12/15/11 Using urinal  Problem: Phase II Progression Outcomes Goal: Progress activity as tolerated unless otherwise ordered Outcome: Progressing PT  Working with pt.. Using a walker to ambulate

## 2011-12-16 ENCOUNTER — Encounter (HOSPITAL_COMMUNITY): Payer: Self-pay | Admitting: Internal Medicine

## 2011-12-16 DIAGNOSIS — G4733 Obstructive sleep apnea (adult) (pediatric): Secondary | ICD-10-CM | POA: Diagnosis present

## 2011-12-16 DIAGNOSIS — K219 Gastro-esophageal reflux disease without esophagitis: Secondary | ICD-10-CM | POA: Diagnosis present

## 2011-12-16 DIAGNOSIS — N400 Enlarged prostate without lower urinary tract symptoms: Secondary | ICD-10-CM | POA: Diagnosis present

## 2011-12-16 DIAGNOSIS — E785 Hyperlipidemia, unspecified: Secondary | ICD-10-CM | POA: Diagnosis present

## 2011-12-16 DIAGNOSIS — I10 Essential (primary) hypertension: Secondary | ICD-10-CM | POA: Diagnosis present

## 2011-12-16 DIAGNOSIS — E119 Type 2 diabetes mellitus without complications: Secondary | ICD-10-CM | POA: Diagnosis present

## 2011-12-16 DIAGNOSIS — R5381 Other malaise: Secondary | ICD-10-CM | POA: Diagnosis present

## 2011-12-16 DIAGNOSIS — D509 Iron deficiency anemia, unspecified: Secondary | ICD-10-CM

## 2011-12-16 HISTORY — DX: Iron deficiency anemia, unspecified: D50.9

## 2011-12-16 LAB — GLUCOSE, CAPILLARY
Glucose-Capillary: 224 mg/dL — ABNORMAL HIGH (ref 70–99)
Glucose-Capillary: 199 mg/dL — ABNORMAL HIGH (ref 70–99)
Glucose-Capillary: 242 mg/dL — ABNORMAL HIGH (ref 70–99)

## 2011-12-16 LAB — OCCULT BLOOD X 1 CARD TO LAB, STOOL: Fecal Occult Bld: NEGATIVE

## 2011-12-16 NOTE — Progress Notes (Signed)
Pt reported having trouble eating dinner tonight because the chicken was too dry and he is edentulous. Pt was evaluated by Dietician previously today, and per RD note, pt denied having trouble eating meals. However, pt may benefit from having chopped meats at meals in case the meat is too dry according the pt.

## 2011-12-16 NOTE — Progress Notes (Signed)
Inpatient Diabetes Program Recommendations  AACE/ADA: New Consensus Statement on Inpatient Glycemic Control (2009)  Target Ranges:  Prepandial:   less than 140 mg/dL      Peak postprandial:   less than 180 mg/dL (1-2 hours)      Critically ill patients:  140 - 180 mg/dL   Reason for Visit: Hyperglycemia  Inpatient Diabetes Program Recommendations HgbA1C: Need updated HgbA1C to assess glycemic control prior to hospitalization.  Note: Last HgbA1C - 8.2% on 05/22/2011.  Will follow.

## 2011-12-16 NOTE — Progress Notes (Addendum)
PROGRESS NOTE  [name] ZOX:[id number] DOB: 17-Feb-1941 DOA: 12/14/2011 PCP: No primary provider on file.  Brief narrative: [name] is a 71 year old man with a past medical history of chronic respiratory failure, degenerative disc disease and osteoarthritis who was admitted on 12/15/2011 with worsening dyspnea, falling and inability to walk. He is being treated for community-acquired pneumonia, COPD exacerbation, and deconditioning.  Assessment/Plan: Principal Problem:  *Community acquired pneumonia /  COPD (chronic obstructive pulmonary disease) with acute bronchitis The patient was admitted and sputum cultures were ordered. He was placed on empiric Levaquin. He is provided with nebulized bronchodilator therapy, oxygen therapy, and Tessalon Perles for cough. Active Problems:  Falls /  Physical deconditioning The patient is physically deconditioned with multiple falls in the home. Radiographs were done on admission which did not reveal any evidence for fracture. Physical therapy has been consulted and recommends rehabilitation.  The social worker has been consulted for placement help.  Chronic respiratory failure Secondary to morbid obesity/obesity hypoventilation syndrome and COPD Patient is being maintained on oxygen therapy. He is home oxygen dependent.  Obesity, morbid (more than 100 lbs over ideal weight or BMI > 40) A dietitian evaluation has been performed.  CKD (chronic kidney disease) stage 3, GFR 30-59 ml/min Patient's renal function is currently stable. His GFR is actually better than his usual baseline values.  Obstructive sleep apnea Continue CPAP each bedtime.  Diabetes mellitus Continue Amaryl CBGs 100-224 over the past 24 hours. Hemoglobin A1c has been ordered.  Hypertension Continue Lotensin and indoor. Dyslipidemia Continue Zocor Benign prostatic hypertrophy Continue Flomax Gastroesophageal reflux disease Continue Protonix.  Microcytic anemia We'll check stool  studies, and an anemia panel.  Code Status: Full Family Communication: Bergeron,Betty Other [phone] Disposition Plan: To SNF for rehab  Consultants:  Registered dietitian  Procedures:  None  Antibiotics:  Levaquin 12/15/2011--->   Subjective  [name] feels well. His breathing is stable. He complains of a dry cough. He has not moved his bowels today, and thinks this is due to pain medicine, but he declines treatment for constipation at this time. He is receptive to rehabilitation.   Objective    Interim History: Patient was stable overnight.   Objective: Filed Vitals:   12/15/11 0542 12/15/11 1420 12/15/11 2253 12/16/11 0537  BP: 137/77 123/82 120/79 142/87  Pulse: 72 76 79 79  Temp: 97.9 F (36.6 C) 97.9 F (36.6 C) 97.5 F (36.4 C) 98.3 F (36.8 C)  TempSrc: Oral Oral Oral Oral  Resp: 18 16 20 20   Height:      Weight:      SpO2: 98% 97% 98% 95%    Intake/Output Summary (Last 24 hours) at 12/16/11 0824 Last data filed at 12/16/11 0538  Gross per 24 hour  Intake   1350 ml  Output   2285 ml  Net   -935 ml    Exam: Gen:  NAD Cardiovascular:  RRR, No M/R/G Respiratory: Lungs CTAB Gastrointestinal: Abdomen soft, NT/ND with normal active bowel sounds. Extremities: 1+ edema to the lower extremities    Data Reviewed: Basic Metabolic Panel:  Lab 12/15/11 8119 12/14/11 2200  NA 138 138  K 4.1 4.7  CL 102 104  CO2 30 26  GLUCOSE 110* 53*  BUN 16 16  CREATININE 1.31 1.23  CALCIUM 9.2 9.3  MG -- --  PHOS -- --   GFR Estimated Creatinine Clearance: 76.3 ml/min (by C-G formula based on Cr of 1.31).  CBC:  Lab 12/15/11 0355 12/14/11 2200  WBC 5.9 6.7  NEUTROABS -- 3.0  HGB 10.5* 11.3*  HCT 33.9* 36.7*  MCV 75.7* 75.7*  PLT 204 196   Cardiac Enzymes:  Lab 12/14/11 2200  CKTOTAL --  CKMB --  CKMBINDEX --  TROPONINI <0.30   CBG:  Lab 12/16/11 0742 12/15/11 2315 12/15/11 1728 12/15/11 1159 12/15/11 0735  GLUCAP 224* 207* 179* 178* 100*     Studies:  Dg Pelvis 1-2 Views 12/14/2011  IMPRESSION: Suboptimal study as described above.  No gross fracture or subluxation.  Original Report Authenticated By: Natasha Mead, M.D.    Dg Tibia/fibula Left 12/14/2011 IMPRESSION:  No acute osseous abnormality.  Original Report Authenticated By: Genevive Bi, M.D.    Dg Tibia/fibula Right 12/14/2011  IMPRESSION: No fracture of the right tibia or fibula.  Original Report Authenticated By: Genevive Bi, M.D.    Dg Chest Port 1 View 12/14/2011  IMPRESSION: Mild bibasilar airspace opacities raise concern for mild pneumonia.  Original Report Authenticated By: Tonia Ghent, M.D.    Scheduled Meds:    . antiseptic oral rinse  15 mL Mouth Rinse q12n4p  . benazepril  20 mg Oral BID  . chlorhexidine  15 mL Mouth Rinse BID  . enoxaparin (LOVENOX) injection  70 mg Subcutaneous Q24H  . FLUoxetine  20 mg Oral Daily  . gabapentin  600 mg Oral QID  . glimepiride  2 mg Oral QAC breakfast  . insulin aspart  0-15 Units Subcutaneous TID WC  . insulin aspart  0-5 Units Subcutaneous QHS  . isosorbide mononitrate  60 mg Oral Daily  . levofloxacin (LEVAQUIN) IV  750 mg Intravenous Q24H  . pantoprazole  40 mg Oral Q1200  . polyvinyl alcohol  1 drop Left Eye Daily  . simvastatin  20 mg Oral QHS  . Tamsulosin HCl  0.4 mg Oral Daily   Continuous Infusions:     LOS: 2 days   [name], MD Pager [phone]  12/16/2011, 8:24 AM

## 2011-12-17 LAB — FERRITIN: Ferritin: 75 ng/mL (ref 22–322)

## 2011-12-17 LAB — IRON AND TIBC
Saturation Ratios: 26 % (ref 20–55)
UIBC: 198 ug/dL (ref 125–400)

## 2011-12-17 LAB — RETICULOCYTES
RBC.: 4.57 MIL/uL (ref 4.22–5.81)
Retic Ct Pct: 1.4 % (ref 0.4–3.1)

## 2011-12-17 LAB — GLUCOSE, CAPILLARY
Glucose-Capillary: 207 mg/dL — ABNORMAL HIGH (ref 70–99)
Glucose-Capillary: 261 mg/dL — ABNORMAL HIGH (ref 70–99)

## 2011-12-17 MED ORDER — LEVOFLOXACIN 750 MG PO TABS
750.0000 mg | ORAL_TABLET | Freq: Every day | ORAL | Status: DC
  Administered 2011-12-17: 750 mg via ORAL
  Filled 2011-12-17 (×2): qty 1

## 2011-12-17 NOTE — Progress Notes (Signed)
Mr. Fredin prefers to go home, but will allow [place] to send his information to skilled facilities as a back up plan. This evening he will speak to his wife about going home.    Gretta Cool, MSW, Marine scientist Clinical Social Work [employer]

## 2011-12-17 NOTE — Progress Notes (Signed)
ANTIBIOTIC CONSULT NOTE - FOLLOW UP  Pharmacy Consult for Levaquin Indication: pneumonia  No Known Allergies  Patient Measurements: Height: 6\' 1"  (185.4 cm) Weight: 319 lb 10.7 oz (145 kg) IBW/kg (Calculated) : 79.9   Vital Signs: Temp: 98.4 F (36.9 C) (03/20 0443) Temp src: Oral (03/20 0443) BP: 131/79 mmHg (03/20 0443) Pulse Rate: 83  (03/20 0443) Intake/Output from previous day: 03/19 0701 - 03/20 0700 In: 580 [P.O.:580] Out: 1400 [Urine:1400] Intake/Output from this shift: Total I/O In: 480 [P.O.:480] Out: 400 [Urine:400]  Labs:  Basename 12/15/11 0355 12/14/11 2200  WBC 5.9 6.7  HGB 10.5* 11.3*  PLT 204 196  LABCREA -- --  CREATININE 1.31 1.23   Estimated Creatinine Clearance: 76.3 ml/min (by C-G formula based on Cr of 1.31). No results found for this basename: VANCOTROUGH:2,VANCOPEAK:2,VANCORANDOM:2,GENTTROUGH:2,GENTPEAK:2,GENTRANDOM:2,TOBRATROUGH:2,TOBRAPEAK:2,TOBRARND:2,AMIKACINPEAK:2,AMIKACINTROU:2,AMIKACIN:2, in the last 72 hours   Microbiology: No results found for this or any previous visit (from the past 720 hour(s)).  Anti-infectives     Start     Dose/Rate Route Frequency Ordered Stop   12/15/11 0015   Levofloxacin (LEVAQUIN) IVPB 750 mg  Status:  Discontinued        750 mg 100 mL/hr over 90 Minutes Intravenous Every 24 hours 12/15/11 0000 12/15/11 0212   12/15/11 0000   Levofloxacin (LEVAQUIN) IVPB 750 mg        750 mg 100 mL/hr over 90 Minutes Intravenous Every 24 hours 12/15/11 0251           Assessment:  71yo M with CAP.  Day 3 Levaquin IV.  Afebrile.   SCr last checked on 3/18: 1.33, CrCl 76, 51N.  Goal of Therapy:  Appropriate dose for renal fxn.  Plan:   Change Levaquin to PO per protocol. Cont 750mg  daily.  Check SCr in am. If it has trended up, may want to adjust dose.  [name] 12/17/2011,1:37 PM

## 2011-12-17 NOTE — Progress Notes (Signed)
Physical Therapy Treatment Patient Details Name: [name] MRN: [id number] DOB: Mar 04, 1941 Today's Date: 12/17/2011  PT Assessment/Plan  PT - Assessment/Plan Comments on Treatment Session: Patient able to ambulate couple of times today; improved with lowering height of walker as initially upon standing unable to ambulate with walker too high.  Motivated to try despite c/o knee pain. PT Plan: Discharge plan remains appropriate PT Frequency: Min 3X/week Follow Up Recommendations: Skilled nursing facility Equipment Recommended: Defer to next venue PT Goals  Acute Rehab PT Goals Pt will go Sit to Stand: with supervision PT Goal: Sit to Stand - Progress: Progressing toward goal Pt will go Stand to Sit: with supervision PT Goal: Stand to Sit - Progress: Progressing toward goal Pt will Ambulate: 1 - 15 feet;with min assist;with least restrictive assistive device PT Goal: Ambulate - Progress: Progressing toward goal  PT Treatment Precautions/Restrictions  Precautions Precautions: Fall Required Braces or Orthoses: No Restrictions Weight Bearing Restrictions: No Mobility (including Balance) Bed Mobility Bed Mobility: No (patient OOB) Transfers Sit to Stand: 1: +2 Total assist;From chair/3-in-1;With armrests Sit to Stand Details (indicate cue type and reason): cues for hand placement; pt = 50%; assist and cues for ant weight shift and for getting hands up to walker. Stand to Sit: 1: +2 Total assist;With armrests;To chair/3-in-1 Stand to Sit Details: pt=65%; for safety with lowering Ambulation/Gait Ambulation/Gait Assistance: 1: +2 Total assist Ambulation/Gait Assistance Details (indicate cue type and reason): pt=65-70% Ambulation Distance (Feet): 5 Feet (times two) Assistive device: Rolling walker Gait Pattern: Decreased stance time - left;Decreased stride length;Lateral trunk lean to right  Posture/Postural Control Posture/Postural Control: Postural limitations Postural  Limitations: right lateral lean, both knees with lateral riding patellae Exercise  Total Joint Exercises Ankle Circles/Pumps: AROM;5 reps;Left;Seated (c/o pain when trying on right) Heel Slides: AAROM;Both;10 reps;Seated End of Session PT - End of Session Equipment Utilized During Treatment: Gait belt Activity Tolerance: Patient limited by fatigue Patient left: in chair;with call bell in reach General Behavior During Session: [facility] for tasks performed Cognition: [facility] for tasks performed  [facility] 12/17/2011, 1:50 PM

## 2011-12-17 NOTE — ED Provider Notes (Signed)
Medical screening examination/treatment/procedure(s) were conducted as a shared visit with non-physician practitioner(s) and myself.  I personally evaluated the patient during the encounter   Loren Racer, MD 12/17/11 [phone]

## 2011-12-17 NOTE — Progress Notes (Signed)
Patient ID: [name], male   DOB: [birth year], 71 y.o.   MRN: [id number]   Assessment/Plan:   Principal Problem:   *Community acquired pneumonia / COPD (chronic obstructive pulmonary disease) with acute bronchitis  - pt clinically improving - continue Levaquin - continue supportive care with nebulized bronchodilator therapy, oxygen therapy, and Tessalon Perles for cough  Active Problems:   Falls / Physical deconditioning  - The patient is physically deconditioned with multiple falls in the home.  - Physical therapy has been consulted and recommends rehabilitation at SNF - placement pending at this time  Chronic respiratory failure  - Secondary to morbid obesity/obesity hypoventilation syndrome and COPD  - He is home oxygen dependent.  - continue to monitor vitals per floor protocol   CKD (chronic kidney disease) stage 3, GFR 30-59 ml/min  - Patient's renal function is currently stable and at his baseline - BMP in AM  Obstructive sleep apnea  - Continue CPAP each bedtime.   Diabetes mellitus  - Continue Amaryl   - HgA1C pending  Hypertension  - stable  Dyslipidemia  - Continue Zocor   Benign prostatic hypertrophy  - Continue Flomax   Gastroesophageal reflux disease  - Continue Protonix.   Microcytic anemia  - anemia panel panel  EDUCATION - test results and diagnostic studies were discussed with patient  - patient verbalized the understanding - questions were answered at the bedside and contact information was provided for additional questions or concerns  Subjective: No events overnight. Patient denies chest pain, shortness of breath, abdominal pain.   Objective:  Vital signs in last 24 hours:  Filed Vitals:   12/16/11 0537 12/16/11 1459 12/16/11 2127 12/17/11 0443  BP: 142/87 139/82 142/78 131/79  Pulse: 79 82 75 83  Temp: 98.3 F (36.8 C) 97.6 F (36.4 C) 97.9 F (36.6 C) 98.4 F (36.9 C)  TempSrc: Oral Oral Oral Oral  Resp: 20 20 20 20     SpO2: 95% 95% 98% 98%    Intake/Output from previous day:   Gross per 24 hour  Intake    720 ml  Output   1550 ml  Net   -830 ml    Physical Exam: General: Alert, awake, in no acute distress. HEENT: No bruits, no goiter. Moist mucous membranes, no scleral icterus, no conjunctival pallor. Heart: Regular rate and rhythm, S1/S2 +, no murmurs, rubs, gallops. Lungs: Clear to auscultation bilaterally. No wheezing, no rhonchi, no rales.  Abdomen: Soft, nontender, nondistended, positive bowel sounds. Extremities: No clubbing or cyanosis, trace bilateral pitting edema,  positive pedal pulses. Neuro: Grossly nonfocal.  Lab Results:  Lab 12/15/11 0355 12/14/11 2200  WBC 5.9 6.7  HGB 10.5* 11.3*  HCT 33.9* 36.7*  PLT 204 196  MCV 75.7* 75.7*    Lab 12/15/11 0355 12/14/11 2200  NA 138 138  K 4.1 4.7  CL 102 104  CO2 30 26  GLUCOSE 110* 53*  BUN 16 16  CREATININE 1.31 1.23  CALCIUM 9.2 9.3    Lab 12/14/11 2200  CKMB --  TROPONINI <0.30  MYOGLOBIN --   Medications: Scheduled Meds:   . antiseptic oral rinse  15 mL Mouth Rinse q12n4p  . benazepril  20 mg Oral BID  . chlorhexidine  15 mL Mouth Rinse BID  . enoxaparin (LOVENOX) injection  70 mg Subcutaneous Q24H  . FLUoxetine  20 mg Oral Daily  . gabapentin  600 mg Oral QID  . glimepiride  2 mg Oral QAC breakfast  . insulin  aspart  0-15 Units Subcutaneous TID WC  . insulin aspart  0-5 Units Subcutaneous QHS  . isosorbide mononitrate  60 mg Oral Daily  . levofloxacin (LEVAQUIN) IV  750 mg Intravenous Q24H  . pantoprazole  40 mg Oral Q1200  . polyvinyl alcohol  1 drop Left Eye Daily  . simvastatin  20 mg Oral QHS  . Tamsulosin HCl  0.4 mg Oral Daily   Continuous Infusions:  PRN Meds:.acetaminophen, acetaminophen, albuterol, benzonatate, HYDROcodone-acetaminophen, ipratropium, morphine, oxyCODONE, oxyCODONE-acetaminophen, sodium chloride    LOS: 3 days   MAGICK-Yair Dusza 12/17/2011, 1:32 PM  [facility]  HOSPITALIST Pager: [phone]

## 2011-12-17 NOTE — Plan of Care (Signed)
Problem: Phase III Progression Outcomes Goal: O2 sats > or equal to 93% on room air Outcome: Not Met (add Reason) Pt is O2 dependant at home and will not be weened off of O2 during hospital stay.

## 2011-12-18 ENCOUNTER — Inpatient Hospital Stay (HOSPITAL_COMMUNITY): Payer: Medicare Other

## 2011-12-18 LAB — BASIC METABOLIC PANEL
BUN: 30 mg/dL — ABNORMAL HIGH (ref 6–23)
Creatinine, Ser: 2.37 mg/dL — ABNORMAL HIGH (ref 0.50–1.35)
GFR calc Af Amer: 30 mL/min — ABNORMAL LOW (ref >90)
GFR calc non Af Amer: 26 mL/min — ABNORMAL LOW (ref >90)
Glucose, Bld: 232 mg/dL — ABNORMAL HIGH (ref 70–99)

## 2011-12-18 LAB — EXPECTORATED SPUTUM ASSESSMENT W GRAM STAIN, RFLX TO RESP C

## 2011-12-18 LAB — GLUCOSE, CAPILLARY
Glucose-Capillary: 245 mg/dL — ABNORMAL HIGH (ref 70–99)
Glucose-Capillary: 213 mg/dL — ABNORMAL HIGH (ref 70–99)
Glucose-Capillary: 273 mg/dL — ABNORMAL HIGH (ref 70–99)

## 2011-12-18 MED ORDER — INSULIN GLARGINE 100 UNIT/ML ~~LOC~~ SOLN
10.0000 [IU] | Freq: Every day | SUBCUTANEOUS | Status: DC
  Administered 2011-12-18: 10 [IU] via SUBCUTANEOUS

## 2011-12-18 MED ORDER — LEVOFLOXACIN 750 MG PO TABS
750.0000 mg | ORAL_TABLET | ORAL | Status: DC
  Administered 2011-12-19: 750 mg via ORAL
  Filled 2011-12-18: qty 1

## 2011-12-18 NOTE — Progress Notes (Signed)
Patient ID: [name], male   DOB: 07-18-1941, 71 y.o.   MRN: [id number]  Subjective: No events overnight. Patient denies chest pain, shortness of breath, abdominal pain. He reports no urine output since midnight.  Objective:  Vital signs in last 24 hours:  Filed Vitals:   12/17/11 1120 12/17/11 1402 12/17/11 2142 12/18/11 0537  BP:  152/77 106/62 123/76  Pulse: 101 90 82 86  Temp:  98.3 F (36.8 C) 98.2 F (36.8 C) 97.6 F (36.4 C)  TempSrc:  Oral Oral Oral  Resp:  20 18 16   Height:      Weight:      SpO2: 94% 95% 97% 97%    Intake/Output from previous day:   Intake/Output Summary (Last 24 hours) at 12/18/11 1303 Last data filed at 12/18/11 0538  Gross per 24 hour  Intake      0 ml  Output      0 ml  Net      0 ml    Physical Exam: General: Alert, awake, oriented x3, in no acute distress. HEENT: No bruits, no goiter. Moist mucous membranes, no scleral icterus, no conjunctival pallor. Heart: Regular rate and rhythm, S1/S2 +, no murmurs, rubs, gallops. Lungs: Clear to auscultation bilaterally with bibasilar crackles. No wheezing, no rhonchi, no rales.  Abdomen: Soft, nontender, nondistended, positive bowel sounds. Extremities: No clubbing or cyanosis, +2 bilateral pitting edema,  positive pedal pulses. Neuro: Grossly nonfocal.  Lab Results:  Basic Metabolic Panel:    Component Value Date/Time   NA 134* 12/18/2011 0430   K 4.9 12/18/2011 0430   CL 99 12/18/2011 0430   CO2 27 12/18/2011 0430   BUN 30* 12/18/2011 0430   CREATININE 2.37* 12/18/2011 0430   GLUCOSE 232* 12/18/2011 0430   CALCIUM 9.0 12/18/2011 0430   CBC:    Component Value Date/Time   WBC 5.9 12/15/2011 0355   HGB 10.5* 12/15/2011 0355   HCT 33.9* 12/15/2011 0355   PLT 204 12/15/2011 0355   MCV 75.7* 12/15/2011 0355   NEUTROABS 3.0 12/14/2011 2200   LYMPHSABS 2.9 12/14/2011 2200   MONOABS 0.6 12/14/2011 2200   EOSABS 0.2 12/14/2011 2200   BASOSABS 0.0 12/14/2011 2200      Lab 12/15/11 0355  12/14/11 2200  WBC 5.9 6.7  HGB 10.5* 11.3*  HCT 33.9* 36.7*  PLT 204 196  MCV 75.7* 75.7*  MCH 23.4* 23.3*  MCHC 31.0 30.8  RDW 16.3* 16.3*  LYMPHSABS -- 2.9  MONOABS -- 0.6  EOSABS -- 0.2  BASOSABS -- 0.0  BANDABS -- --    Lab 12/18/11 0430 12/15/11 0355 12/14/11 2200  NA 134* 138 138  K 4.9 4.1 4.7  CL 99 102 104  CO2 27 30 26   GLUCOSE 232* 110* 53*  BUN 30* 16 16  CREATININE 2.37* 1.31 1.23  CALCIUM 9.0 9.2 9.3  MG -- -- --   No results found for this basename: INR:5,PROTIME:5 in the last 168 hours Cardiac markers:  Lab 12/14/11 2200  CKMB --  TROPONINI <0.30  MYOGLOBIN --   No components found with this basename: POCBNP:3 Recent Results (from the past 240 hour(s))  CULTURE, SPUTUM-ASSESSMENT     Status: Normal   Collection Time   12/18/11 10:00 AM      Component Value Range Status Comment   Specimen Description SPUTUM   Final    Special Requests NONE   Final    Sputum evaluation     Final    Value:  THIS SPECIMEN IS ACCEPTABLE. RESPIRATORY CULTURE REPORT TO FOLLOW.   Report Status 12/18/2011 FINAL   Final     Studies/Results: No results found.  Medications: Scheduled Meds:   . antiseptic oral rinse  15 mL Mouth Rinse q12n4p  . benazepril  20 mg Oral BID  . chlorhexidine  15 mL Mouth Rinse BID  . enoxaparin (LOVENOX) injection  70 mg Subcutaneous Q24H  . FLUoxetine  20 mg Oral Daily  . gabapentin  600 mg Oral QID  . glimepiride  2 mg Oral QAC breakfast  . insulin aspart  0-15 Units Subcutaneous TID WC  . insulin aspart  0-5 Units Subcutaneous QHS  . isosorbide mononitrate  60 mg Oral Daily  . levofloxacin  750 mg Oral Q48H  . pantoprazole  40 mg Oral Q1200  . polyvinyl alcohol  1 drop Left Eye Daily  . simvastatin  20 mg Oral QHS  . Tamsulosin HCl  0.4 mg Oral Daily  . DISCONTD: levofloxacin (LEVAQUIN) IV  750 mg Intravenous Q24H  . DISCONTD: levofloxacin  750 mg Oral QHS   Continuous Infusions:  PRN Meds:.acetaminophen, acetaminophen,  albuterol, benzonatate, HYDROcodone-acetaminophen, ipratropium, morphine, oxyCODONE, oxyCODONE-acetaminophen, sodium chloride  Assessment/Plan:  Principal Problem:  *Community acquired pneumonia / COPD (chronic obstructive pulmonary disease) with acute bronchitis  - clinically improving with no shortness of breath and maintaining oxygen saturations > 93% on 2 L [place] - will continue levaquin - monitor vitals per floor protocol  Active Problems:  Falls / Physical deconditioning  - The patient is physically deconditioned with multiple falls in the home - Radiographs were done on admission which did not reveal any evidence for fracture. - recommendation is for pt to go to SNF for continuation of PT, pt not in agreement - will address again once pt stable for discharge  Chronic respiratory failure  - Secondary to morbid obesity/obesity hypoventilation syndrome and COPD - Patient is being maintained on oxygen therapy - He is home oxygen dependent.    ACUTE on Chronic kidney disease, CKD (chronic kidney disease) stage 3, GFR 30-59 ml/min  - pt anuric since midnight - creatinine indicative of acute worsening renal failure - will place foley cath in and obtain bladder scan - will obtain renal US to rule out obstruction - obtain urine sodium and creatinine - BMP in AM  Obstructive sleep apnea  - Continue CPAP each bedtime.   Diabetes mellitus  - will d/c glimepiride - initiate Lantus for now - continue SSI  Hypertension  - Continue Lotensin and imdur  Dyslipidemia  Continue Zocor   Benign prostatic hypertrophy  Continue Flomax   Gastroesophageal reflux disease  Continue Protonix.   Microcytic anemia  - Hg/HCt remain stable and at pt's baseline  DISPOSITION:  - plan of care and diagnosis, diagnostic studies and test results were discussed with pt - pt verbalized understanding   LOS: 4 days   MAGICK-Teniola Tseng 12/18/2011, 1:03 PM  [facility] HOSPITALIST Pager:  [phone]

## 2011-12-18 NOTE — Progress Notes (Signed)
Spoke with [facility] admissions coordinator who reports they are able to offer pt a bed tomorrow 3/22. CSW updated MD and pt's wife.  Dellie Burns, MSW, [place] [phone] (coverage)

## 2011-12-18 NOTE — Progress Notes (Signed)
CSW provided bed offers to pt and pt's wife. Pt has chosen [facility]. CSW notified Janie in admissions. Awaiting return call from Janie to confirm bed available today. Will f/u with pt's wife and MD after Wille Celeste returns call. Dellie Burns, MSW, LCSWA [phone] (cover teams 1, 2, 6)

## 2011-12-18 NOTE — Progress Notes (Signed)
ANTIBIOTIC CONSULT NOTE - FOLLOW UP  Pharmacy Consult for Levaquin Indication: pneumonia  No Known Allergies  Patient Measurements: Height: 6\' 1"  (185.4 cm) Weight: 319 lb 10.7 oz (145 kg) IBW/kg (Calculated) : 79.9   Vital Signs: Temp: 97.6 F (36.4 C) (03/21 0537) Temp src: Oral (03/21 0537) BP: 123/76 mmHg (03/21 0537) Pulse Rate: 86  (03/21 0537) Intake/Output from previous day: 03/20 0701 - 03/21 0700 In: 960 [P.O.:960] Out: 600 [Urine:600] Intake/Output from this shift:    Labs:  Basename 12/18/11 0430  WBC --  HGB --  PLT --  LABCREA --  CREATININE 2.37*   Estimated Creatinine Clearance: 42.2 ml/min (by C-G formula based on Cr of 2.37).    Microbiology: Recent Results (from the past 720 hour(s))  CULTURE, SPUTUM-ASSESSMENT     Status: Normal   Collection Time   12/18/11 10:00 AM      Component Value Range Status Comment   Specimen Description SPUTUM   Final    Special Requests NONE   Final    Sputum evaluation     Final    Value: THIS SPECIMEN IS ACCEPTABLE. RESPIRATORY CULTURE REPORT TO FOLLOW.   Report Status 12/18/2011 FINAL   Final     Anti-infectives     Start     Dose/Rate Route Frequency Ordered Stop   12/17/11 2200   levofloxacin (LEVAQUIN) tablet 750 mg        750 mg Oral Daily at bedtime 12/17/11 1341     12/15/11 0015   Levofloxacin (LEVAQUIN) IVPB 750 mg  Status:  Discontinued        750 mg 100 mL/hr over 90 Minutes Intravenous Every 24 hours 12/15/11 0000 12/15/11 0212   12/15/11 0000   Levofloxacin (LEVAQUIN) IVPB 750 mg  Status:  Discontinued        750 mg 100 mL/hr over 90 Minutes Intravenous Every 24 hours 12/15/11 0251 12/17/11 1341         Assessment:  70yo M with CAP.  Day 4 Levaquin IV.  Afebrile.   SCr up (2.4) today from 3/18 (1.33), CrCl 42  Goal of Therapy:  Appropriate dose for renal fxn.  Plan:   Change Levaquin to 750mg  q48h  Cont to monitor SCr and adjust dose as appropriate  Gwen Her  PharmD  [phone] 12/18/2011 11:30 AM

## 2011-12-18 NOTE — Progress Notes (Signed)
In-patient Diabetes Program  Results for [name], [name] (MRN [id number]) as of 12/18/2011 11:40  Ref. Range 12/17/2011 04:45  Hemoglobin A1C Latest Range: <5.7 % 8.1 (H)  Results for [name], [name] (MRN [id number]) as of 12/18/2011 11:40  Ref. Range 12/17/2011 07:26 12/17/2011 12:31 12/17/2011 16:52 12/17/2011 22:00 12/18/2011 08:22  Glucose-Capillary Latest Range: 70-99 mg/dL 782 (H) 956 (H) 213 (H) 226 (H) 278 (H)   May want to consider increasing Amaryl to 4 mg QD and/or adding small amt of basal insulin such as Lantus 10 units QHS.  Will follow.

## 2011-12-19 LAB — CBC
HCT: 32.5 % — ABNORMAL LOW (ref 39.0–52.0)
Hemoglobin: 10.1 g/dL — ABNORMAL LOW (ref 13.0–17.0)
MCH: 23.5 pg — ABNORMAL LOW (ref 26.0–34.0)
MCV: 75.6 fL — ABNORMAL LOW (ref 78.0–100.0)
RBC: 4.3 MIL/uL (ref 4.22–5.81)

## 2011-12-19 LAB — BASIC METABOLIC PANEL
CO2: 27 mEq/L (ref 19–32)
Chloride: 99 mEq/L (ref 96–112)
GFR calc non Af Amer: 27 mL/min — ABNORMAL LOW (ref >90)
Glucose, Bld: 275 mg/dL — ABNORMAL HIGH (ref 70–99)
Potassium: 4.8 mEq/L (ref 3.5–5.1)
Sodium: 132 mEq/L — ABNORMAL LOW (ref 135–145)

## 2011-12-19 LAB — GLUCOSE, CAPILLARY
Glucose-Capillary: 243 mg/dL — ABNORMAL HIGH (ref 70–99)
Glucose-Capillary: 239 mg/dL — ABNORMAL HIGH (ref 70–99)

## 2011-12-19 MED ORDER — DOCUSATE SODIUM 100 MG PO CAPS
100.0000 mg | ORAL_CAPSULE | Freq: Two times a day (BID) | ORAL | Status: DC
  Administered 2011-12-19 – 2011-12-22 (×7): 100 mg via ORAL
  Filled 2011-12-19 (×10): qty 1

## 2011-12-19 MED ORDER — FUROSEMIDE 10 MG/ML IJ SOLN
40.0000 mg | Freq: Once | INTRAMUSCULAR | Status: AC
  Administered 2011-12-19: 40 mg via INTRAVENOUS
  Filled 2011-12-19: qty 4

## 2011-12-19 MED ORDER — INSULIN GLARGINE 100 UNIT/ML ~~LOC~~ SOLN
20.0000 [IU] | Freq: Every day | SUBCUTANEOUS | Status: DC
  Administered 2011-12-19 – 2011-12-20 (×2): 20 [IU] via SUBCUTANEOUS

## 2011-12-19 MED ORDER — POLYETHYLENE GLYCOL 3350 17 G PO PACK
17.0000 g | PACK | Freq: Every day | ORAL | Status: DC
  Administered 2011-12-19 – 2011-12-22 (×4): 17 g via ORAL
  Filled 2011-12-19 (×5): qty 1

## 2011-12-19 NOTE — Progress Notes (Signed)
PATIENT REFUSES TO WEAR CPAP. PLEASE D/C ORDER. THANKS,RT

## 2011-12-19 NOTE — Progress Notes (Signed)
Patient ID: [name], male   DOB: 1941/08/03, 71 y.o.   MRN: [id number]  Subjective: No events overnight. Patient denies chest pain, shortness of breath, abdominal pain. Had bowel movement and reports ambulating short distance.  Objective:  Vital signs in last 24 hours:  Filed Vitals:   12/18/11 0537 12/18/11 1418 12/18/11 2145 12/19/11 0535  BP: 123/76 111/71 126/67 129/76  Pulse: 86 85 84 86  Temp: 97.6 F (36.4 C) 98.9 F (37.2 C) 98.4 F (36.9 C) 98.2 F (36.8 C)  TempSrc: Oral Oral Oral Oral  Resp: 16 18 18 16   Height:      Weight:      SpO2: 97% 96% 97% 98%    Intake/Output from previous day:   Intake/Output Summary (Last 24 hours) at 12/19/11 1228 Last data filed at 12/19/11 0535  Gross per 24 hour  Intake    300 ml  Output    925 ml  Net   -625 ml    Physical Exam: General: Alert, awake, oriented x3, in no acute distress. HEENT: No bruits, no goiter. Moist mucous membranes, no scleral icterus, no conjunctival pallor. Heart: Regular rate and rhythm, S1/S2 +, no murmurs, rubs, gallops. Lungs: Clear to auscultation bilaterally. No wheezing, no rhonchi, no rales.  Abdomen: Soft, nontender, nondistended, positive bowel sounds. Extremities: No clubbing or cyanosis, +1  pitting edema,  positive pedal pulses. Neuro: Grossly nonfocal.  Lab Results:  Basic Metabolic Panel:    Component Value Date/Time   NA 132* 12/19/2011 0416   K 4.8 12/19/2011 0416   CL 99 12/19/2011 0416   CO2 27 12/19/2011 0416   BUN 39* 12/19/2011 0416   CREATININE 2.29* 12/19/2011 0416   GLUCOSE 275* 12/19/2011 0416   CALCIUM 8.5 12/19/2011 0416   CBC:    Component Value Date/Time   WBC 6.2 12/19/2011 0416   HGB 10.1* 12/19/2011 0416   HCT 32.5* 12/19/2011 0416   PLT 197 12/19/2011 0416   MCV 75.6* 12/19/2011 0416   NEUTROABS 3.0 12/14/2011 2200   LYMPHSABS 2.9 12/14/2011 2200   MONOABS 0.6 12/14/2011 2200   EOSABS 0.2 12/14/2011 2200   BASOSABS 0.0 12/14/2011 2200      Lab 12/19/11  0416 12/15/11 0355 12/14/11 2200  WBC 6.2 5.9 6.7  HGB 10.1* 10.5* 11.3*  HCT 32.5* 33.9* 36.7*  PLT 197 204 196  MCV 75.6* 75.7* 75.7*  MCH 23.5* 23.4* 23.3*  MCHC 31.1 31.0 30.8  RDW 16.3* 16.3* 16.3*  LYMPHSABS -- -- 2.9  MONOABS -- -- 0.6  EOSABS -- -- 0.2  BASOSABS -- -- 0.0  BANDABS -- -- --    Lab 12/19/11 0416 12/18/11 0430 12/15/11 0355 12/14/11 2200  NA 132* 134* 138 138  K 4.8 4.9 4.1 4.7  CL 99 99 102 104  CO2 27 27 30 26   GLUCOSE 275* 232* 110* 53*  BUN 39* 30* 16 16  CREATININE 2.29* 2.37* 1.31 1.23  CALCIUM 8.5 9.0 9.2 9.3  MG -- -- -- --   No results found for this basename: INR:5,PROTIME:5 in the last 168 hours Cardiac markers:  Lab 12/14/11 2200  CKMB --  TROPONINI <0.30  MYOGLOBIN --   No components found with this basename: POCBNP:3 Recent Results (from the past 240 hour(s))  CULTURE, RESPIRATORY     Status: Normal (Preliminary result)   Collection Time   12/18/11  9:58 Donald      Component Value Range Status Comment   Specimen Description SPUTUM   Final  Special Requests NONE   Final    Gram Stain     Final    Value: FEW WBC PRESENT,BOTH PMN AND MONONUCLEAR     FEW SQUAMOUS EPITHELIAL CELLS PRESENT     NO ORGANISMS SEEN   Culture NORMAL OROPHARYNGEAL FLORA   Final    Report Status PENDING   Incomplete   CULTURE, SPUTUM-ASSESSMENT     Status: Normal   Collection Time   12/18/11 10:00 Donald      Component Value Range Status Comment   Specimen Description SPUTUM   Final    Special Requests NONE   Final    Sputum evaluation     Final    Value: THIS SPECIMEN IS ACCEPTABLE. RESPIRATORY CULTURE REPORT TO FOLLOW.   Report Status 12/18/2011 FINAL   Final     Studies/Results: US Renal  12/18/2011  *RADIOLOGY REPORT*  Clinical Data: Acute renal failure.  Rule out obstruction.  RENAL/URINARY TRACT ULTRASOUND COMPLETE  Comparison:  CT of 12/03/2010.  Findings:  Right Kidney:  No hydronephrosis.  Mild renal cortical thinning.  Left Kidney:  10.8 cm. No  hydronephrosis.  Mild renal cortical thinning.  Bladder:  Collapsed.  Exam mildly degraded by patient body habitus.  IMPRESSION: No hydronephrosis.  Mild renal cortical thinning.  Original Report Authenticated By: Consuello Bossier, M.D.    Medications: Scheduled Meds:   . antiseptic oral rinse  15 mL Mouth Rinse q12n4p  . benazepril  20 mg Oral BID  . chlorhexidine  15 mL Mouth Rinse BID  . docusate sodium  100 mg Oral BID  . enoxaparin (LOVENOX) injection  70 mg Subcutaneous Q24H  . FLUoxetine  20 mg Oral Daily  . gabapentin  600 mg Oral QID  . insulin aspart  0-15 Units Subcutaneous TID WC  . insulin aspart  0-5 Units Subcutaneous QHS  . insulin glargine  10 Units Subcutaneous QHS  . isosorbide mononitrate  60 mg Oral Daily  . levofloxacin  750 mg Oral Q48H  . pantoprazole  40 mg Oral Q1200  . polyethylene glycol  17 g Oral Daily  . polyvinyl alcohol  1 drop Left Eye Daily  . simvastatin  20 mg Oral QHS  . Tamsulosin HCl  0.4 mg Oral Daily  . DISCONTD: glimepiride  2 mg Oral QAC breakfast   Continuous Infusions:  PRN Meds:.acetaminophen, acetaminophen, albuterol, benzonatate, HYDROcodone-acetaminophen, ipratropium, morphine, oxyCODONE, oxyCODONE-acetaminophen, sodium chloride  Assessment/Plan  Principal Problem:  *Community acquired pneumonia / COPD (chronic obstructive pulmonary disease) with acute bronchitis  - clinically improving with no shortness of breath and maintaining oxygen saturations > 93% on 2 L [place]  - will continue levaquin  - monitor vitals per floor protocol   Active Problems:  Falls / Physical deconditioning  - The patient is physically deconditioned with multiple falls in the home  - Radiographs were done on admission which did not reveal any evidence for fracture.  - recommendation is for pt to go to SNF for continuation of PT, pt not in agreement  - will address again once pt stable for discharge   Chronic respiratory failure  - Secondary to morbid  obesity/obesity hypoventilation syndrome and COPD  - Patient is being maintained on oxygen therapy  - He is home oxygen dependent.   ACUTE on Chronic kidney disease, CKD (chronic kidney disease) stage 3, GFR 30-59 ml/min  - pt anuric since midnight  - creatinine indicative of acute worsening renal failure but now trending down - wcontinue foley cath  -  BMP in Donald   Obstructive sleep apnea  - Continue CPAP each bedtime.   Diabetes mellitus  - will d/c glimepiride  - increase Lantus for now  - continue SSI   Hypertension  - Continue Lotensin and imdur   Dyslipidemia  Continue Zocor   Benign prostatic hypertrophy  Continue Flomax   Gastroesophageal reflux disease  Continue Protonix.   Microcytic anemia  - Hg/HCt remain stable and at pt's baseline   DISPOSITION:  - plan of care and diagnosis, diagnostic studies and test results were discussed with pt  - pt verbalized understanding    LOS: 5 days   MAGICK-Keng Jewel 12/19/2011, 12:28 PM  [facility] Pager: [phone]

## 2011-12-19 NOTE — Progress Notes (Signed)
Per MD, Pt should be ready for d/c on Monday.  Notified facility.  Providence Crosby, LCSWA Clinical Social Work [phone]

## 2011-12-20 LAB — BASIC METABOLIC PANEL
BUN: 43 mg/dL — ABNORMAL HIGH (ref 6–23)
CO2: 27 mEq/L (ref 19–32)
Chloride: 102 mEq/L (ref 96–112)
Creatinine, Ser: 1.85 mg/dL — ABNORMAL HIGH (ref 0.50–1.35)
GFR calc Af Amer: 40 mL/min — ABNORMAL LOW (ref >90)

## 2011-12-20 LAB — CBC
HCT: 32.5 % — ABNORMAL LOW (ref 39.0–52.0)
MCV: 75.4 fL — ABNORMAL LOW (ref 78.0–100.0)
RDW: 16.2 % — ABNORMAL HIGH (ref 11.5–15.5)
WBC: 6.2 10*3/uL (ref 4.0–10.5)

## 2011-12-20 LAB — CULTURE, RESPIRATORY W GRAM STAIN

## 2011-12-20 LAB — GLUCOSE, CAPILLARY: Glucose-Capillary: 265 mg/dL — ABNORMAL HIGH (ref 70–99)

## 2011-12-20 MED ORDER — SODIUM POLYSTYRENE SULFONATE 15 GM/60ML PO SUSP
15.0000 g | Freq: Once | ORAL | Status: AC
  Administered 2011-12-20: 15 g via ORAL
  Filled 2011-12-20: qty 60

## 2011-12-20 MED ORDER — LEVOFLOXACIN 750 MG PO TABS
750.0000 mg | ORAL_TABLET | ORAL | Status: DC
  Administered 2011-12-20 – 2011-12-21 (×2): 750 mg via ORAL
  Filled 2011-12-20 (×4): qty 1

## 2011-12-20 NOTE — Progress Notes (Signed)
Patient ID: [name], male   DOB: [birth year], 71 y.o.   MRN: [id number]  Subjective: No events overnight. Patient denies chest pain, shortness of breath, abdominal pain. Had bowel movement and reports ambulating.  Objective:  Vital signs in last 24 hours:  Filed Vitals:   12/19/11 2135 12/20/11 0520 12/20/11 0937 12/20/11 1327  BP: 123/74 132/80 106/72 134/61  Pulse: 83 85 85 85  Temp: 98.3 F (36.8 C) 97.5 F (36.4 C) 98.9 F (37.2 C) 98.6 F (37 C)  TempSrc: Oral Oral Oral Oral  Resp: 18 18 18 20   Height:      Weight:      SpO2: 96% 97% 94% 95%    Intake/Output from previous day:   Intake/Output Summary (Last 24 hours) at 12/20/11 1505 Last data filed at 12/20/11 1418  Gross per 24 hour  Intake    480 ml  Output   2025 ml  Net  -1545 ml    Physical Exam: General: Alert, awake, oriented x3, in no acute distress. HEENT: No bruits, no goiter. Moist mucous membranes, no scleral icterus, no conjunctival pallor. Heart: Regular rate and rhythm, S1/S2 +, no murmurs, rubs, gallops. Lungs: Clear to auscultation bilaterally. No wheezing, no rhonchi, no rales.  Abdomen: Soft, nontender, nondistended, positive bowel sounds. Extremities: No clubbing or cyanosis, bilateral pitting edema,  positive pedal pulses. Neuro: Grossly nonfocal.  Lab Results:  Basic Metabolic Panel:    Component Value Date/Time   NA 135 12/20/2011 0359   K 5.2* 12/20/2011 0359   CL 102 12/20/2011 0359   CO2 27 12/20/2011 0359   BUN 43* 12/20/2011 0359   CREATININE 1.85* 12/20/2011 0359   GLUCOSE 293* 12/20/2011 0359   CALCIUM 9.0 12/20/2011 0359   CBC:    Component Value Date/Time   WBC 6.2 12/20/2011 0359   HGB 10.1* 12/20/2011 0359   HCT 32.5* 12/20/2011 0359   PLT 197 12/20/2011 0359   MCV 75.4* 12/20/2011 0359   NEUTROABS 3.0 12/14/2011 2200   LYMPHSABS 2.9 12/14/2011 2200   MONOABS 0.6 12/14/2011 2200   EOSABS 0.2 12/14/2011 2200   BASOSABS 0.0 12/14/2011 2200      Lab 12/20/11 0359  12/19/11 0416 12/15/11 0355 12/14/11 2200  WBC 6.2 6.2 5.9 6.7  HGB 10.1* 10.1* 10.5* 11.3*  HCT 32.5* 32.5* 33.9* 36.7*  PLT 197 197 204 196  MCV 75.4* 75.6* 75.7* 75.7*  MCH 23.4* 23.5* 23.4* 23.3*  MCHC 31.1 31.1 31.0 30.8  RDW 16.2* 16.3* 16.3* 16.3*  LYMPHSABS -- -- -- 2.9  MONOABS -- -- -- 0.6  EOSABS -- -- -- 0.2  BASOSABS -- -- -- 0.0  BANDABS -- -- -- --    Lab 12/20/11 0359 12/19/11 0416 12/18/11 0430 12/15/11 0355 12/14/11 2200  NA 135 132* 134* 138 138  K 5.2* 4.8 4.9 4.1 4.7  CL 102 99 99 102 104  CO2 27 27 27 30 26   GLUCOSE 293* 275* 232* 110* 53*  BUN 43* 39* 30* 16 16  CREATININE 1.85* 2.29* 2.37* 1.31 1.23  CALCIUM 9.0 8.5 9.0 9.2 9.3  MG -- -- -- -- --   No results found for this basename: INR:5,PROTIME:5 in the last 168 hours Cardiac markers:  Lab 12/14/11 2200  CKMB --  TROPONINI <0.30  MYOGLOBIN --   No components found with this basename: POCBNP:3 Recent Results (from the past 240 hour(s))  CULTURE, RESPIRATORY     Status: Normal   Collection Time   12/18/11  9:58  AM      Component Value Range Status Comment   Specimen Description SPUTUM   Final    Special Requests NONE   Final    Gram Stain     Final    Value: FEW WBC PRESENT,BOTH PMN AND MONONUCLEAR     FEW SQUAMOUS EPITHELIAL CELLS PRESENT     NO ORGANISMS SEEN   Culture NORMAL OROPHARYNGEAL FLORA   Final    Report Status 12/20/2011 FINAL   Final   CULTURE, SPUTUM-ASSESSMENT     Status: Normal   Collection Time   12/18/11 10:00 AM      Component Value Range Status Comment   Specimen Description SPUTUM   Final    Special Requests NONE   Final    Sputum evaluation     Final    Value: THIS SPECIMEN IS ACCEPTABLE. RESPIRATORY CULTURE REPORT TO FOLLOW.   Report Status 12/18/2011 FINAL   Final     Studies/Results: US Renal  12/18/2011  *RADIOLOGY REPORT*  Clinical Data: Acute renal failure.  Rule out obstruction.  RENAL/URINARY TRACT ULTRASOUND COMPLETE  Comparison:  CT of 12/03/2010.   Findings:  Right Kidney:  No hydronephrosis.  Mild renal cortical thinning.  Left Kidney:  10.8 cm. No hydronephrosis.  Mild renal cortical thinning.  Bladder:  Collapsed.  Exam mildly degraded by patient body habitus.  IMPRESSION: No hydronephrosis.  Mild renal cortical thinning.  Original Report Authenticated By: Consuello Bossier, M.D.    Medications: Scheduled Meds:   . antiseptic oral rinse  15 mL Mouth Rinse q12n4p  . benazepril  20 mg Oral BID  . chlorhexidine  15 mL Mouth Rinse BID  . docusate sodium  100 mg Oral BID  . enoxaparin (LOVENOX) injection  70 mg Subcutaneous Q24H  . FLUoxetine  20 mg Oral Daily  . gabapentin  600 mg Oral QID  . insulin aspart  0-15 Units Subcutaneous TID WC  . insulin aspart  0-5 Units Subcutaneous QHS  . insulin glargine  20 Units Subcutaneous QHS  . isosorbide mononitrate  60 mg Oral Daily  . levofloxacin  750 mg Oral Q24H  . pantoprazole  40 mg Oral Q1200  . polyethylene glycol  17 g Oral Daily  . polyvinyl alcohol  1 drop Left Eye Daily  . simvastatin  20 mg Oral QHS  . Tamsulosin HCl  0.4 mg Oral Daily  . DISCONTD: levofloxacin  750 mg Oral Q48H   Continuous Infusions:  PRN Meds:.acetaminophen, acetaminophen, albuterol, benzonatate, HYDROcodone-acetaminophen, ipratropium, morphine, oxyCODONE, oxyCODONE-acetaminophen, sodium chloride  Assessment/Plan:  Principal Problem:  *Community acquired pneumonia / COPD (chronic obstructive pulmonary disease) with acute bronchitis  - clinically improving with no shortness of breath and maintaining oxygen saturations > 93% on 2 L [place]  - will continue levaquin  - monitor vitals per floor protocol   Active Problems:  Falls / Physical deconditioning  - The patient is physically deconditioned with multiple falls in the home  - Radiographs were done on admission which did not reveal any evidence for fracture.  - recommendation is for pt to go to SNF for continuation of PT, pt not in agreement  - will  address again once pt stable for discharge   Chronic respiratory failure  - Secondary to morbid obesity/obesity hypoventilation syndrome and COPD  - Patient is being maintained on oxygen therapy  - He is home oxygen dependent.   ACUTE on Chronic kidney disease, CKD (chronic kidney disease) stage 3, GFR 30-59 ml/min  -  creatinine indicative of acute worsening renal failure but now trending down  - will continue foley cath  - BMP in AM   Obstructive sleep apnea  - Continue CPAP each bedtime.   Diabetes mellitus  - will d/c glimepiride  - increase Lantus for now  - continue SSI   Hypertension  - Continue Lotensin and imdur   Dyslipidemia  Continue Zocor   Benign prostatic hypertrophy  Continue Flomax   Gastroesophageal reflux disease  Continue Protonix.   Microcytic anemia  - Hg/HCt remain stable and at pt's baseline   DISPOSITION:  - plan of care and diagnosis, diagnostic studies and test results were discussed with pt  - pt verbalized understanding     LOS: 6 days   MAGICK-Freyja Govea 12/20/2011, 3:05 PM  [facility] Pager: [phone]

## 2011-12-20 NOTE — Progress Notes (Signed)
ANTIBIOTIC CONSULT NOTE - FOLLOW UP  Pharmacy Consult for Levaquin Indication: CAP, COPD w/ acute bronchitis  No Known Allergies  Patient Measurements: Height: 6\' 1"  (185.4 cm) Weight: 319 lb 10.7 oz (145 kg) IBW/kg (Calculated) : 79.9   Labs:  Basename 12/20/11 0359 12/19/11 0416 12/18/11 1727 12/18/11 0430  WBC 6.2 6.2 -- --  HGB 10.1* 10.1* -- --  PLT 197 197 -- --  LABCREA -- -- 248.4 --  CREATININE 1.85* 2.29* -- 2.37*   Assessment:  70 YOM on Day #6 of Levaquin for the treatment of CAP and COPD w/ acute bronchitis.  A/C renal failure on admit, Scr trending down and CrCl now > 50 ml/min.    Afebrile, WBC wnl.  Plan:   Change Levaquin to 750 mg PO q24h dosing.    MD please reassess LOT  Geoffry Paradise Thi 12/20/2011,1:13 PM

## 2011-12-21 LAB — BASIC METABOLIC PANEL
Chloride: 103 mEq/L (ref 96–112)
GFR calc Af Amer: 36 mL/min — ABNORMAL LOW (ref >90)
Potassium: 5 mEq/L (ref 3.5–5.1)
Sodium: 137 mEq/L (ref 135–145)

## 2011-12-21 LAB — CBC
Platelets: 190 10*3/uL (ref 150–400)
RDW: 16.3 % — ABNORMAL HIGH (ref 11.5–15.5)
WBC: 6.3 10*3/uL (ref 4.0–10.5)

## 2011-12-21 LAB — GLUCOSE, CAPILLARY: Glucose-Capillary: 229 mg/dL — ABNORMAL HIGH (ref 70–99)

## 2011-12-21 LAB — PRO B NATRIURETIC PEPTIDE: Pro B Natriuretic peptide (BNP): 116.4 pg/mL (ref 0–125)

## 2011-12-21 MED ORDER — FUROSEMIDE 10 MG/ML IJ SOLN
40.0000 mg | Freq: Once | INTRAMUSCULAR | Status: AC
  Administered 2011-12-21: 40 mg via INTRAVENOUS
  Filled 2011-12-21: qty 4

## 2011-12-21 MED ORDER — INSULIN GLARGINE 100 UNIT/ML ~~LOC~~ SOLN
30.0000 [IU] | Freq: Every day | SUBCUTANEOUS | Status: DC
  Administered 2011-12-21: 30 [IU] via SUBCUTANEOUS

## 2011-12-21 NOTE — Plan of Care (Signed)
Problem: Discharge Progression Outcomes Goal: Discharge plan in place and appropriate Outcome: Completed/Met Date Met:  12/21/11 Pt. To be discharged to [facility] 12/22/11

## 2011-12-21 NOTE — Progress Notes (Signed)
Patient ID: [name], male   DOB: 1941/06/24, 71 y.o.   MRN: [id number]  Subjective: No events overnight. Patient denies chest pain, shortness of breath, abdominal pain.  Objective:  Vital signs in last 24 hours:  Filed Vitals:   12/20/11 0937 12/20/11 1327 12/20/11 2105 12/21/11 0523  BP: 106/72 134/61 134/78 151/76  Pulse: 85 85 80 76  Temp: 98.9 F (37.2 C) 98.6 F (37 C) 98.7 F (37.1 C) 97.7 F (36.5 C)  TempSrc: Oral Oral Oral Oral  Resp: 18 20 20 20   Height:      Weight:      SpO2: 94% 95% 96% 100%    Intake/Output from previous day:   Intake/Output Summary (Last 24 hours) at 12/21/11 1325 Last data filed at 12/21/11 1100  Gross per 24 hour  Intake    600 ml  Output   1950 ml  Net  -1350 ml    Physical Exam: General: Alert, awake, oriented x3, in no acute distress. HEENT: No bruits, no goiter. Moist mucous membranes, no scleral icterus, no conjunctival pallor. Heart: Regular rate and rhythm, S1/S2 +, no murmurs, rubs, gallops. Lungs: Clear to auscultation bilaterally. No wheezing, no rhonchi, no rales.  Abdomen: Soft, nontender, nondistended, positive bowel sounds. Extremities: No clubbing or cyanosis, trace bilateral pitting edema,  positive pedal pulses. Neuro: Grossly nonfocal.  Lab Results:  Basic Metabolic Panel:    Component Value Date/Time   NA 137 12/21/2011 0421   K 5.0 12/21/2011 0421   CL 103 12/21/2011 0421   CO2 30 12/21/2011 0421   BUN 40* 12/21/2011 0421   CREATININE 2.01* 12/21/2011 0421   GLUCOSE 245* 12/21/2011 0421   CALCIUM 9.0 12/21/2011 0421   CBC:    Component Value Date/Time   WBC 6.3 12/21/2011 0421   HGB 10.1* 12/21/2011 0421   HCT 33.0* 12/21/2011 0421   PLT 190 12/21/2011 0421   MCV 76.6* 12/21/2011 0421   NEUTROABS 3.0 12/14/2011 2200   LYMPHSABS 2.9 12/14/2011 2200   MONOABS 0.6 12/14/2011 2200   EOSABS 0.2 12/14/2011 2200   BASOSABS 0.0 12/14/2011 2200      Lab 12/21/11 0421 12/20/11 0359 12/19/11 0416 12/15/11 0355  12/14/11 2200  WBC 6.3 6.2 6.2 5.9 6.7  HGB 10.1* 10.1* 10.1* 10.5* 11.3*  HCT 33.0* 32.5* 32.5* 33.9* 36.7*  PLT 190 197 197 204 196  MCV 76.6* 75.4* 75.6* 75.7* 75.7*  MCH 23.4* 23.4* 23.5* 23.4* 23.3*  MCHC 30.6 31.1 31.1 31.0 30.8  RDW 16.3* 16.2* 16.3* 16.3* 16.3*  LYMPHSABS -- -- -- -- 2.9  MONOABS -- -- -- -- 0.6  EOSABS -- -- -- -- 0.2  BASOSABS -- -- -- -- 0.0  BANDABS -- -- -- -- --    Lab 12/21/11 0421 12/20/11 0359 12/19/11 0416 12/18/11 0430 12/15/11 0355  NA 137 135 132* 134* 138  K 5.0 5.2* 4.8 4.9 4.1  CL 103 102 99 99 102  CO2 30 27 27 27 30   GLUCOSE 245* 293* 275* 232* 110*  BUN 40* 43* 39* 30* 16  CREATININE 2.01* 1.85* 2.29* 2.37* 1.31  CALCIUM 9.0 9.0 8.5 9.0 9.2  MG -- -- -- -- --   No results found for this basename: INR:5,PROTIME:5 in the last 168 hours Cardiac markers:  Lab 12/14/11 2200  CKMB --  TROPONINI <0.30  MYOGLOBIN --   No components found with this basename: POCBNP:3 Recent Results (from the past 240 hour(s))  CULTURE, RESPIRATORY     Status:  Normal   Collection Time   12/18/11  9:58 AM      Component Value Range Status Comment   Specimen Description SPUTUM   Final    Special Requests NONE   Final    Gram Stain     Final    Value: FEW WBC PRESENT,BOTH PMN AND MONONUCLEAR     FEW SQUAMOUS EPITHELIAL CELLS PRESENT     NO ORGANISMS SEEN   Culture NORMAL OROPHARYNGEAL FLORA   Final    Report Status 12/20/2011 FINAL   Final   CULTURE, SPUTUM-ASSESSMENT     Status: Normal   Collection Time   12/18/11 10:00 AM      Component Value Range Status Comment   Specimen Description SPUTUM   Final    Special Requests NONE   Final    Sputum evaluation     Final    Value: THIS SPECIMEN IS ACCEPTABLE. RESPIRATORY CULTURE REPORT TO FOLLOW.   Report Status 12/18/2011 FINAL   Final     Studies/Results: No results found.  Medications: Scheduled Meds:   . antiseptic oral rinse  15 mL Mouth Rinse q12n4p  . benazepril  20 mg Oral BID  .  chlorhexidine  15 mL Mouth Rinse BID  . docusate sodium  100 mg Oral BID  . enoxaparin (LOVENOX) injection  70 mg Subcutaneous Q24H  . FLUoxetine  20 mg Oral Daily  . gabapentin  600 mg Oral QID  . insulin aspart  0-15 Units Subcutaneous TID WC  . insulin aspart  0-5 Units Subcutaneous QHS  . insulin glargine  20 Units Subcutaneous QHS  . isosorbide mononitrate  60 mg Oral Daily  . levofloxacin  750 mg Oral Q24H  . pantoprazole  40 mg Oral Q1200  . polyethylene glycol  17 g Oral Daily  . polyvinyl alcohol  1 drop Left Eye Daily  . simvastatin  20 mg Oral QHS  . sodium polystyrene  15 g Oral Once  . Tamsulosin HCl  0.4 mg Oral Daily   Continuous Infusions:  PRN Meds:.acetaminophen, acetaminophen, albuterol, benzonatate, HYDROcodone-acetaminophen, ipratropium, morphine, oxyCODONE, oxyCODONE-acetaminophen, sodium chloride  Assessment/Plan:  Principal Problem:  *Community acquired pneumonia / COPD (chronic obstructive pulmonary disease) with acute bronchitis  - clinically improving with no shortness of breath and maintaining oxygen saturations > 93% on 2 L [place]  - will continue levaquin and will switch to PO - monitor vitals per floor protocol   Active Problems:  Falls / Physical deconditioning  - The patient is physically deconditioned with multiple falls in the home  - Radiographs were done on admission which did not reveal any evidence for fracture.  - recommendation is for pt to go to SNF for continuation of PT, pt not in agreement  - will address again once pt stable for discharge   Chronic respiratory failure  - Secondary to morbid obesity/obesity hypoventilation syndrome and COPD  - Patient is being maintained on oxygen therapy  - He is home oxygen dependent.   ACUTE on Chronic kidney disease, CKD (chronic kidney disease) stage 3, GFR 30-59 ml/min  - creatinine indicative of acute worsening renal failure  - will continue foley cath, bladder scan - BMP in AM  - discontinue  benazepril for now and will monitor BP and readjust the medication regimen as indicated  Obstructive sleep apnea  - Continue CPAP each bedtime prn  Diabetes mellitus  - will d/c glimepiride  - increase Lantus for now  - continue SSI   Hypertension  -  Continue imdur   Dyslipidemia  Continue Zocor   Benign prostatic hypertrophy  Continue Flomax   Gastroesophageal reflux disease  Continue Protonix.   Microcytic anemia  - Hg/HCt remain stable and at pt's baseline   DISPOSITION:  - plan of care and diagnosis, diagnostic studies and test results were discussed with pt  - pt verbalized understanding    LOS: 7 days   MAGICK-Ibrahim Mcpheeters 12/21/2011, 1:25 PM  [facility] Pager: [phone]

## 2011-12-22 LAB — BASIC METABOLIC PANEL
Chloride: 100 mEq/L (ref 96–112)
GFR calc Af Amer: 42 mL/min — ABNORMAL LOW (ref >90)
GFR calc non Af Amer: 37 mL/min — ABNORMAL LOW (ref >90)
Glucose, Bld: 238 mg/dL — ABNORMAL HIGH (ref 70–99)
Potassium: 5 mEq/L (ref 3.5–5.1)
Sodium: 136 mEq/L (ref 135–145)

## 2011-12-22 LAB — CBC
Hemoglobin: 10.4 g/dL — ABNORMAL LOW (ref 13.0–17.0)
MCHC: 30.6 g/dL (ref 30.0–36.0)
RDW: 16.2 % — ABNORMAL HIGH (ref 11.5–15.5)
WBC: 5.6 10*3/uL (ref 4.0–10.5)

## 2011-12-22 LAB — GLUCOSE, CAPILLARY: Glucose-Capillary: 317 mg/dL — ABNORMAL HIGH (ref 70–99)

## 2011-12-22 MED ORDER — FUROSEMIDE 20 MG PO TABS
20.0000 mg | ORAL_TABLET | Freq: Once | ORAL | Status: AC
  Administered 2011-12-22: 20 mg via ORAL
  Filled 2011-12-22: qty 1

## 2011-12-22 MED ORDER — INSULIN GLARGINE 100 UNIT/ML ~~LOC~~ SOLN: 30.0000 [IU] | Freq: Every day | SUBCUTANEOUS | Status: DC

## 2011-12-22 MED ORDER — LEVOFLOXACIN 750 MG PO TABS: 750.0000 mg | ORAL_TABLET | ORAL | Status: AC

## 2011-12-22 NOTE — Discharge Summary (Signed)
Patient ID: [name] MRN: [id number] DOB/AGE: [birth year] 73 y.o.  Admit date: 12/14/2011 Discharge date: 12/22/2011  Primary Care Physician:  No primary provider on file.  Discharge Diagnoses:    Present on Admission:  .Community acquired pneumonia .Falls .Physical deconditioning .Chronic respiratory failure .Obesity, morbid (more than 100 lbs over ideal weight or BMI > 40) .Coronary artery disease .CKD (chronic kidney disease) stage 3, GFR 30-59 ml/min .COPD (chronic obstructive pulmonary disease) with acute bronchitis .Obstructive sleep apnea .Physical deconditioning .Obstructive sleep apnea .Diabetes mellitus .Hypertension .Dyslipidemia .Benign prostatic hypertrophy .Gastroesophageal reflux disease .Microcytic anemia  Principal Problem:  *Community acquired pneumonia Active Problems:  Falls  Chronic respiratory failure  Obesity, morbid (more than 100 lbs over ideal weight or BMI > 40)  CKD (chronic kidney disease) stage 3, GFR 30-59 ml/min  COPD (chronic obstructive pulmonary disease) with acute bronchitis  Physical deconditioning  Obstructive sleep apnea  Diabetes mellitus  Hypertension  Dyslipidemia  Benign prostatic hypertrophy  Gastroesophageal reflux disease  Microcytic anemia   Medication List  As of 12/22/2011  3:19 PM   TAKE these medications         benazepril 20 MG tablet   Commonly known as: LOTENSIN   Take 20 mg by mouth 2 (two) times daily.      clotrimazole-betamethasone cream   Commonly known as: LOTRISONE   Apply 1 application topically 2 (two) times daily. Applies to affected area on legs.      FLUoxetine 20 MG tablet   Commonly known as: PROZAC   Take 20 mg by mouth daily.      gabapentin 600 MG tablet   Commonly known as: NEURONTIN   Take 600 mg by mouth 4 (four) times daily.      glimepiride 2 MG tablet   Commonly known as: AMARYL   Take 2 mg by mouth daily before breakfast.      insulin glargine 100 UNIT/ML injection   Commonly known as: LANTUS   Inject 30 Units into the skin at bedtime.      isosorbide mononitrate 60 MG 24 hr tablet   Commonly known as: IMDUR   Take 60 mg by mouth daily.      levofloxacin 750 MG tablet   Commonly known as: LEVAQUIN   Take 1 tablet (750 mg total) by mouth daily.      omeprazole 40 MG capsule   Commonly known as: PRILOSEC   Take 40 mg by mouth daily.      oxyCODONE-acetaminophen 10-325 MG per tablet   Commonly known as: PERCOCET   Take 1 tablet by mouth every 8 (eight) hours as needed. For pain.      simvastatin 20 MG tablet   Commonly known as: ZOCOR   Take 20 mg by mouth at bedtime.      SYSTANE 0.4-0.3 % Soln   Generic drug: Polyethyl Glycol-Propyl Glycol   Place 1 drop into the left eye daily.      Tamsulosin HCl 0.4 MG Caps   Commonly known as: FLOMAX   Take by mouth daily. One capsule 30 mins after the same meal each day      traMADol 50 MG tablet   Commonly known as: ULTRAM   Take 1 tablet (50 mg total) by mouth every 6 (six) hours as needed for pain.            Disposition and Follow-up: Please note that pt has been discharged on Levaquin 750 mg tablet to complete for 7 more days.  In addition, he will have to have BMP drawn in 1 week to ensure he is maintaining good urine output and his Creatinine is at his baseline.   Consults: none  Significant Diagnostic Studies:  Dg Pelvis 1-2 Views  12/14/2011  *RADIOLOGY REPORT*  Clinical Data: Fall, hip pain  PELVIS - 1-2 VIEW  Comparison: 12/03/2010  Findings: The study is markedly limited due to the patient's large body habitus.  A single frontal view of the pelvis shows no gross fracture or subluxation.  IMPRESSION: Suboptimal study as described above.  No gross fracture or subluxation.  Original Report Authenticated By: [name], M.D.   Dg Tibia/fibula Left  12/14/2011  *RADIOLOGY REPORT*  Clinical Data: Leg pain  LEFT TIBIA AND FIBULA - 2 VIEW  Comparison: None.  Findings: No fracture or  dislocation of the left tibia-fibula.  The knee joint and ankle joint are normal.  There is degenerative change of the left knee joint.  Vascular clips noted.  IMPRESSION:  No acute osseous abnormality.  Original Report Authenticated By: [name], M.D.   Dg Tibia/fibula Right  12/14/2011  *RADIOLOGY REPORT*  Clinical Data: Fall, leg pain  RIGHT TIBIA AND FIBULA - 2 VIEW  Comparison: In  Findings: No fracture of the right tibia or fibula.  No ankle joint and the knee joint are intact.  No joint effusion.  There is spurring of the patella.  IMPRESSION: No fracture of the right tibia or fibula.  Original Report Authenticated By: [name], M.D.   Dg Chest Port 1 View  12/14/2011  *RADIOLOGY REPORT*  Clinical Data: Shortness of breath and chest pain for 3 days.  PORTABLE CHEST - 1 VIEW  Comparison: Chest radiograph performed 07/26/2009  Findings: The lungs are relatively well-aerated.  Mild bibasilar airspace opacities raise question for mild pneumonia.  There is no evidence of pleural effusion or pneumothorax.  The cardiomediastinal silhouette is normal in size; the patient is status post median sternotomy.  There are fractures of the visualized sternal wires.  No acute osseous abnormalities are seen.  IMPRESSION: Mild bibasilar airspace opacities raise concern for mild pneumonia.  Original Report Authenticated By: [name], M.D.    Brief H and P: This is a 71 year old gentleman, who is morbidly obese and chronically deconditioned. He states he has fallen 3 times this month, requiring a call to the fire station to assist him up. 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 This Wednesday the patient again fell, he states his right knee gave out. His wife heard a pop. He was unable to get up, again the [facility] was called, they came and assist the patient into bed. The patient usually uses a walker. Since this fall, he states he's not been able  to get around. He walks 15 feet, feels a burning in his right leg and has to sit down. He has not fallen again. Additionally, he has chronic respiratory failure and COPD. He's been having increasing shortness of breath and wheezing for the past few days. He reports a nonproductive cough and chills.  Physical Exam on Discharge:  Filed Vitals:   12/21/11 1735 12/21/11 2205 12/22/11 0549 12/22/11 1404  BP: 130/77 125/80 114/72 152/70  Pulse: 75 76 77 71  Temp: 98.3 F (36.8 C) 97.6 F (36.4 C) 98.5 F (36.9 C) 98.9 F (37.2 C)  TempSrc: Oral Oral Oral Oral  Resp: 20 15 14 14   Height:  Weight:      SpO2: 98% 98% 98% 96%     Intake/Output Summary (Last 24 hours) at 12/22/11 1519 Last data filed at 12/22/11 1500  Gross per 24 hour  Intake   1500 ml  Output   3250 ml  Net  -1750 ml    General: Alert, awake, oriented x3, in no acute distress. HEENT: No bruits, no goiter. Heart: Regular rate and rhythm, without murmurs, rubs, gallops. Lungs: Clear to auscultation bilaterally. Abdomen: Soft, nontender, nondistended, positive bowel sounds. Extremities: No clubbing cyanosis, bilateral; ankle edema with positive pedal pulses. Neuro: Grossly intact, nonfocal.  CBC:    Component Value Date/Time   WBC 5.6 12/22/2011 0419   HGB 10.4* 12/22/2011 0419   HCT 34.0* 12/22/2011 0419   PLT 204 12/22/2011 0419   MCV 75.7* 12/22/2011 0419   NEUTROABS 3.0 12/14/2011 2200   LYMPHSABS 2.9 12/14/2011 2200   MONOABS 0.6 12/14/2011 2200   EOSABS 0.2 12/14/2011 2200   BASOSABS 0.0 12/14/2011 2200    Basic Metabolic Panel:    Component Value Date/Time   NA 136 12/22/2011 0419   K 5.0 12/22/2011 0419   CL 100 12/22/2011 0419   CO2 30 12/22/2011 0419   BUN 40* 12/22/2011 0419   CREATININE 1.77* 12/22/2011 0419   GLUCOSE 238* 12/22/2011 0419   CALCIUM 9.4 12/22/2011 0419    Hospital Course:   Principal Problem:  *Community acquired pneumonia / COPD (chronic obstructive pulmonary disease) with acute  bronchitis  - clinically improved with no shortness of breath and maintaining oxygen saturations > 93% on 2 L [place]  - he has responded well to antibiotics in hospital and was successfully transitioned to PO antibiotic - will continue levaquin upon dischare for 7 additional days  Active Problems:  Falls / Physical deconditioning  - The patient is physically deconditioned with multiple falls in the home  - Radiographs were done on admission which did not reveal any evidence for fracture.  - recommendation is for pt to go to SNF for continuation of PT  Chronic respiratory failure  - Secondary to morbid obesity/obesity hypoventilation syndrome and COPD  - Patient is being maintained on oxygen therapy  - He is home oxygen dependent.   ACUTE on Chronic kidney disease, CKD (chronic kidney disease) stage 3, GFR 30-59 ml/min  - creatinine indicative of acute worsening renal failure  - bladder scan was negative for volume retention - renal US unremarkable  Obstructive sleep apnea  - Continue CPAP each bedtime prn   Diabetes mellitus  - will d/c glimepiride  - started Lantus and pt has responded well  Microcytic anemia  - Hg/HCt remained stable and at pt's baseline   DISPOSITION:  - plan of care and diagnosis, diagnostic studies and test results were discussed with pt and wife over the phone - pt verbalized understanding   Time spent on Discharge: Over 30 minutes  Signed: Debbora Presto 12/22/2011, 3:19 PM  [facility], pager #: [phone] Main office number: [phone]

## 2011-12-22 NOTE — Plan of Care (Signed)
Problem: Discharge Progression Outcomes Goal: Other Discharge Outcomes/Goals Outcome: Adequate for Discharge Pt discharged to [facility] via ambulance.

## 2011-12-22 NOTE — Consult Note (Signed)
WOC consult Note Reason for Consult:denuded area on right foot, second toe, partial thickness Wound type: abrasion  Pressure Ulcer POA: No Measurement: .4cm round x .2cm  Wound XBJ:YNWGN, pink, resurfacing Drainage (amount, consistency, odor) none Periwound:intact Dressing procedure/placement/frequency:We will wash feet and apply the moisturizer, skin conditioning cream once daily.  Elevation/floatation of heels is ordered. A geomatt chair pad for pressure redistribution while OOB in the chair is provided. I will not follow.  Please re-consult if needed. Thanks, Ladona Mow, MSN, RN, [facility], CWOCN [phone])

## 2011-12-22 NOTE — Progress Notes (Signed)
Inpatient Diabetes Program Recommendations  AACE/ADA: New Consensus Statement on Inpatient Glycemic Control (2009)  Target Ranges:  Prepandial:   less than 140 mg/dL      Peak postprandial:   less than 180 mg/dL (1-2 hours)      Critically ill patients:  140 - 180 mg/dL   Reason for Visit: Hyperglycemia  Results for [name], [name] (MRN [id number]) as of 12/22/2011 12:17  Ref. Range 12/21/2011 09:33 12/21/2011 12:50 12/21/2011 18:13 12/21/2011 22:03 12/22/2011 07:35 12/22/2011 11:25  Glucose-Capillary Latest Range: 70-99 mg/dL 191 (H) 478 (H) 295 (H) 317 (H) 242 (H) 278 (H)    Inpatient Diabetes Program Recommendations Insulin - Meal Coverage: Add meal coverage insulin - Novolog 4 units tidwc HgbA1C: .  Note: Will follow.

## 2011-12-22 NOTE — Progress Notes (Signed)
Patient set to discharge to [facility] today. Wife made aware. [facility] called for transport.   Unice Bailey, LCSWA [phone]

## 2012-08-19 ENCOUNTER — Other Ambulatory Visit: Payer: Self-pay | Admitting: Internal Medicine

## 2012-08-19 DIAGNOSIS — M4800 Spinal stenosis, site unspecified: Secondary | ICD-10-CM

## 2012-08-19 DIAGNOSIS — M549 Dorsalgia, unspecified: Secondary | ICD-10-CM

## 2012-09-02 ENCOUNTER — Ambulatory Visit
Admission: RE | Admit: 2012-09-02 | Discharge: 2012-09-02 | Disposition: A | Discharge status: Home / Self Care | Payer: Medicare Other | Source: Ambulatory Visit | Attending: Internal Medicine | Admitting: Internal Medicine

## 2012-09-02 DIAGNOSIS — M549 Dorsalgia, unspecified: Secondary | ICD-10-CM

## 2012-09-02 DIAGNOSIS — M4800 Spinal stenosis, site unspecified: Secondary | ICD-10-CM

## 2012-09-02 MED ORDER — IOHEXOL 180 MG/ML  SOLN
1.0000 mL | Freq: Once | INTRAMUSCULAR | Status: AC | PRN
  Administered 2012-09-02: 1 mL via EPIDURAL

## 2012-09-02 MED ORDER — METHYLPREDNISOLONE ACETATE 40 MG/ML INJ SUSP (RADIOLOG
120.0000 mg | Freq: Once | INTRAMUSCULAR | Status: AC
  Administered 2012-09-02: 120 mg via EPIDURAL

## 2012-10-25 ENCOUNTER — Emergency Department (HOSPITAL_COMMUNITY): Payer: Medicare Other

## 2012-10-25 ENCOUNTER — Encounter (HOSPITAL_COMMUNITY): Payer: Self-pay | Admitting: Emergency Medicine

## 2012-10-25 ENCOUNTER — Inpatient Hospital Stay (HOSPITAL_COMMUNITY)
Admission: EM | Admit: 2012-10-25 | Discharge: 2012-10-28 | DRG: 315 | Disposition: A | Discharge status: Skilled Nursing Facility | Payer: Medicare Other | Attending: Internal Medicine | Admitting: Internal Medicine

## 2012-10-25 DIAGNOSIS — I10 Essential (primary) hypertension: Secondary | ICD-10-CM

## 2012-10-25 DIAGNOSIS — N400 Enlarged prostate without lower urinary tract symptoms: Secondary | ICD-10-CM | POA: Diagnosis present

## 2012-10-25 DIAGNOSIS — N183 Chronic kidney disease, stage 3 unspecified: Secondary | ICD-10-CM

## 2012-10-25 DIAGNOSIS — Z794 Long term (current) use of insulin: Secondary | ICD-10-CM

## 2012-10-25 DIAGNOSIS — K219 Gastro-esophageal reflux disease without esophagitis: Secondary | ICD-10-CM | POA: Diagnosis present

## 2012-10-25 DIAGNOSIS — G4733 Obstructive sleep apnea (adult) (pediatric): Secondary | ICD-10-CM | POA: Diagnosis present

## 2012-10-25 DIAGNOSIS — E1169 Type 2 diabetes mellitus with other specified complication: Secondary | ICD-10-CM | POA: Diagnosis present

## 2012-10-25 DIAGNOSIS — M549 Dorsalgia, unspecified: Secondary | ICD-10-CM | POA: Diagnosis present

## 2012-10-25 DIAGNOSIS — F329 Major depressive disorder, single episode, unspecified: Secondary | ICD-10-CM | POA: Diagnosis present

## 2012-10-25 DIAGNOSIS — R5383 Other fatigue: Secondary | ICD-10-CM | POA: Diagnosis present

## 2012-10-25 DIAGNOSIS — I959 Hypotension, unspecified: Principal | ICD-10-CM | POA: Diagnosis present

## 2012-10-25 DIAGNOSIS — E11649 Type 2 diabetes mellitus with hypoglycemia without coma: Secondary | ICD-10-CM

## 2012-10-25 DIAGNOSIS — J449 Chronic obstructive pulmonary disease, unspecified: Secondary | ICD-10-CM | POA: Diagnosis present

## 2012-10-25 DIAGNOSIS — N189 Chronic kidney disease, unspecified: Secondary | ICD-10-CM | POA: Diagnosis present

## 2012-10-25 DIAGNOSIS — G589 Mononeuropathy, unspecified: Secondary | ICD-10-CM | POA: Diagnosis present

## 2012-10-25 DIAGNOSIS — Z6838 Body mass index (BMI) 38.0-38.9, adult: Secondary | ICD-10-CM

## 2012-10-25 DIAGNOSIS — Z79899 Other long term (current) drug therapy: Secondary | ICD-10-CM

## 2012-10-25 DIAGNOSIS — K5900 Constipation, unspecified: Secondary | ICD-10-CM | POA: Diagnosis not present

## 2012-10-25 DIAGNOSIS — E162 Hypoglycemia, unspecified: Secondary | ICD-10-CM

## 2012-10-25 DIAGNOSIS — Z951 Presence of aortocoronary bypass graft: Secondary | ICD-10-CM

## 2012-10-25 DIAGNOSIS — N179 Acute kidney failure, unspecified: Secondary | ICD-10-CM | POA: Diagnosis not present

## 2012-10-25 DIAGNOSIS — D509 Iron deficiency anemia, unspecified: Secondary | ICD-10-CM | POA: Diagnosis present

## 2012-10-25 DIAGNOSIS — R531 Weakness: Secondary | ICD-10-CM

## 2012-10-25 DIAGNOSIS — J4489 Other specified chronic obstructive pulmonary disease: Secondary | ICD-10-CM | POA: Diagnosis present

## 2012-10-25 DIAGNOSIS — W19XXXA Unspecified fall, initial encounter: Secondary | ICD-10-CM

## 2012-10-25 DIAGNOSIS — E875 Hyperkalemia: Secondary | ICD-10-CM | POA: Diagnosis not present

## 2012-10-25 DIAGNOSIS — I129 Hypertensive chronic kidney disease with stage 1 through stage 4 chronic kidney disease, or unspecified chronic kidney disease: Secondary | ICD-10-CM | POA: Diagnosis present

## 2012-10-25 DIAGNOSIS — Z9181 History of falling: Secondary | ICD-10-CM

## 2012-10-25 DIAGNOSIS — F0390 Unspecified dementia without behavioral disturbance: Secondary | ICD-10-CM | POA: Diagnosis present

## 2012-10-25 DIAGNOSIS — G629 Polyneuropathy, unspecified: Secondary | ICD-10-CM | POA: Diagnosis present

## 2012-10-25 DIAGNOSIS — I2510 Atherosclerotic heart disease of native coronary artery without angina pectoris: Secondary | ICD-10-CM | POA: Diagnosis present

## 2012-10-25 DIAGNOSIS — H409 Unspecified glaucoma: Secondary | ICD-10-CM | POA: Diagnosis present

## 2012-10-25 DIAGNOSIS — R5381 Other malaise: Secondary | ICD-10-CM

## 2012-10-25 DIAGNOSIS — I509 Heart failure, unspecified: Secondary | ICD-10-CM | POA: Diagnosis present

## 2012-10-25 DIAGNOSIS — R55 Syncope and collapse: Secondary | ICD-10-CM

## 2012-10-25 DIAGNOSIS — G8929 Other chronic pain: Secondary | ICD-10-CM | POA: Diagnosis present

## 2012-10-25 DIAGNOSIS — M129 Arthropathy, unspecified: Secondary | ICD-10-CM | POA: Diagnosis present

## 2012-10-25 DIAGNOSIS — R296 Repeated falls: Secondary | ICD-10-CM

## 2012-10-25 DIAGNOSIS — J9610 Chronic respiratory failure, unspecified whether with hypoxia or hypercapnia: Secondary | ICD-10-CM | POA: Diagnosis present

## 2012-10-25 LAB — CBC WITH DIFFERENTIAL/PLATELET
Eosinophils Absolute: 0.1 10*3/uL (ref 0.0–0.7)
Hemoglobin: 11 g/dL — ABNORMAL LOW (ref 13.0–17.0)
Lymphocytes Relative: 22 % (ref 12–46)
Lymphs Abs: 1.5 10*3/uL (ref 0.7–4.0)
MCH: 23 pg — ABNORMAL LOW (ref 26.0–34.0)
Monocytes Relative: 12 % (ref 3–12)
Neutro Abs: 4.2 10*3/uL (ref 1.7–7.7)
Neutrophils Relative %: 64 % (ref 43–77)
Platelets: 166 10*3/uL (ref 150–400)
RBC: 4.78 MIL/uL (ref 4.22–5.81)
WBC: 6.5 10*3/uL (ref 4.0–10.5)

## 2012-10-25 LAB — COMPREHENSIVE METABOLIC PANEL
ALT: 10 U/L (ref 0–53)
Alkaline Phosphatase: 82 U/L (ref 39–117)
BUN: 52 mg/dL — ABNORMAL HIGH (ref 6–23)
Chloride: 108 mEq/L (ref 96–112)
GFR calc Af Amer: 33 mL/min — ABNORMAL LOW (ref >90)
Glucose, Bld: 69 mg/dL — ABNORMAL LOW (ref 70–99)
Potassium: 4.7 mEq/L (ref 3.5–5.1)
Sodium: 142 mEq/L (ref 135–145)
Total Bilirubin: 0.2 mg/dL — ABNORMAL LOW (ref 0.3–1.2)
Total Protein: 6.9 g/dL (ref 6.0–8.3)

## 2012-10-25 NOTE — ED Provider Notes (Signed)
History     CSN: [id number]  Arrival date & time 10/25/12  2209   First MD Initiated Contact with Patient 10/25/12 2218      Chief Complaint  Patient presents with  . Near Syncope    (Consider location/radiation/quality/duration/timing/severity/associated sxs/prior treatment) The history is provided by the patient.   patient presents with generalized weakness and near syncope. Reportedly has been mostly in bed for the last week. He states he just feels weak all over. All transferring from the chair to bed today he had a decreased level of consciousness. Wife states he was just staring forward. He has a chronic cough but it has been worse the last few days. He also has some increased pain in his right foot. He has chronic pain in his right foot. No abdominal pain. He has had some urinary frequency. His blood pressure was found to be in the 80s systolic for EMS. No chest pain. No fevers.  Past Medical History  Diagnosis Date  . Coronary artery disease   . Diabetes mellitus   . CHF (congestive heart failure)   . Stage III chronic kidney disease   . Arthritis   . COPD (chronic obstructive pulmonary disease)   . Chronic respiratory failure   . Obesity, Class III, BMI 40-49.9 (morbid obesity)   . Obstructive sleep apnea   . Gastroesophageal reflux   . Benign prostatic hypertrophy   . Hyperlipidemia   . Hypertension   . Microcytic anemia 12/16/2011  . CHF (congestive heart failure)     Past Surgical History  Procedure Date  . Back surgery   . Coronary artery bypass graft   . Cholecystectomy   . Tonsillectomy   . Cataract extraction     Family History  Problem Relation Age of Onset  . Cancer      History  Substance Use Topics  . Smoking status: Former Smoker -- 1.0 packs/day for 46 years    Types: Cigarettes    Quit date: 09/02/2001  . Smokeless tobacco: Not on file  . Alcohol Use: No      Review of Systems  Constitutional: Positive for fatigue. Negative for  activity change and appetite change.  HENT: Negative for neck stiffness.   Eyes: Negative for pain.  Respiratory: Positive for cough and shortness of breath. Negative for chest tightness.   Cardiovascular: Negative for chest pain and leg swelling.  Gastrointestinal: Negative for nausea, vomiting, abdominal pain and diarrhea.  Genitourinary: Positive for frequency. Negative for flank pain.  Musculoskeletal: Negative for back pain.  Skin: Negative for rash.  Neurological: Positive for weakness and light-headedness. Negative for numbness and headaches.  Psychiatric/Behavioral: Negative for behavioral problems.    Allergies  Review of patient's allergies indicates no known allergies.  Home Medications   Current Outpatient Rx  Name  Route  Sig  Dispense  Refill  . BENAZEPRIL HCL 20 MG PO TABS   Oral   Take 20 mg by mouth 2 (two) times daily.         Marland Kitchen FLUOXETINE HCL 20 MG PO TABS   Oral   Take 20 mg by mouth daily.         . FUROSEMIDE 40 MG PO TABS   Oral   Take 40 mg by mouth daily as needed. For swelling         . GABAPENTIN 600 MG PO TABS   Oral   Take 600 mg by mouth 4 (four) times daily.         Marland Kitchen  GLIMEPIRIDE 2 MG PO TABS   Oral   Take 2 mg by mouth daily before breakfast.         . INSULIN DETEMIR 100 UNIT/ML [place] SOLN   Subcutaneous   Inject 142 Units into the skin 2 (two) times daily.         . INSULIN LISPRO (HUMAN) 100 UNIT/ML [place] SOLN   Subcutaneous   Inject 0-20 Units into the skin 3 (three) times daily as needed. Home sliding scale         . ISOSORBIDE MONONITRATE ER 60 MG PO TB24   Oral   Take 60 mg by mouth daily.         Marland Kitchen MEMANTINE HCL 10 MG PO TABS   Oral   Take 10 mg by mouth 2 (two) times daily.         Marland Kitchen OMEPRAZOLE 40 MG PO CPDR   Oral   Take 40 mg by mouth daily.         Marland Kitchen POLYETHYL GLYCOL-PROPYL GLYCOL 0.4-0.3 % OP SOLN   Left Eye   Place 1 drop into the left eye daily.          Marland Kitchen PRESCRIPTION MEDICATION    Ophthalmic   Apply to eye. 4 different eye drops         . SIMVASTATIN 20 MG PO TABS   Oral   Take 20 mg by mouth at bedtime.         . TAMSULOSIN HCL 0.4 MG PO CAPS   Oral   Take by mouth daily. One capsule 30 mins after the same meal each day         . OXYCODONE-ACETAMINOPHEN 10-325 MG PO TABS   Oral   Take 1 tablet by mouth every 8 (eight) hours as needed. For pain.           BP 89/48  Pulse 77  Temp 98.2 F (36.8 C) (Oral)  Resp 18  SpO2 95%  Physical Exam  Constitutional:       Patient is morbidly obese  HENT:  Head: Normocephalic.  Eyes: Pupils are equal, round, and reactive to light.  Neck: Neck supple.  Cardiovascular: Normal rate and regular rhythm.   Pulmonary/Chest: Effort normal.       Mildly harsh breath sounds throughout.  Abdominal: There is tenderness.       Mild lower abdominal tenderness without rebound guarding.  Musculoskeletal: He exhibits edema.       Mild bilateral lower extremity pitting edema.  Neurological: He is alert.       Patient with mild confusion. Mildly slow to answer. At baseline per family member.    ED Course  Procedures (including critical care time)   Labs Reviewed  CBC WITH DIFFERENTIAL  COMPREHENSIVE METABOLIC PANEL  URINALYSIS, ROUTINE W REFLEX MICROSCOPIC  TROPONIN I   No results found.   No diagnosis found.   Date: 10/25/2012  Rate: *77  Rhythm: normal sinus rhythm and premature ventricular contractions (PVC)  QRS Axis: normal  Intervals: normal  ST/T Wave abnormalities: normal  Conduction Disutrbances:none  Narrative Interpretation: PVCs are new.   Old EKG Reviewed: changes noted    MDM  Patient presents with generalized weakness the last week and hypotension today. Per family members he is not managing well at home. He did fall and hit his head. Imaging and lab work is pending at this time. He'll likely require admission.        Juliet Rude. Rubin Payor, MD  10/25/12 2310 

## 2012-10-25 NOTE — ED Notes (Signed)
Orange Juice with sugar given per VO Dr. Norlene Campbell.

## 2012-10-25 NOTE — ED Provider Notes (Signed)
Care assumed from Dr. Rubin Payor at change of shift. Patient with near syncope at home while transferring from chair to bed. EMS reports 80 systolic blood pressure. Patient has had her last weakness over the last week, has multiple comorbidities. Expectation is for admission once labwork and CT scan of head return.  In speaking with his wife, patient's blood sugar has been dropping recently. She checked it earlier in the evening and it was low, patient was given a snack. Recheck here today shows glucose of 69. Patient to begin orange juice. Workup shows slightly worse renal insufficiency from last check in March. Patient with generalized weakness, will discuss with hospitalist for admission as wife is unable to care for him home  Results for orders placed during the hospital encounter of 10/25/12  CBC WITH DIFFERENTIAL      Component Value Range   WBC 6.5  4.0 - 10.5 K/uL   RBC 4.78  4.22 - 5.81 MIL/uL   Hemoglobin 11.0 (*) 13.0 - 17.0 g/dL   HCT 62.9 (*) 52.8 - 41.3 %   MCV 73.0 (*) 78.0 - 100.0 fL   MCH 23.0 (*) 26.0 - 34.0 pg   MCHC 31.5  30.0 - 36.0 g/dL   RDW 24.4 (*) 01.0 - 27.2 %   Platelets 166  150 - 400 K/uL   Neutrophils Relative 64  43 - 77 %   Neutro Abs 4.2  1.7 - 7.7 K/uL   Lymphocytes Relative 22  12 - 46 %   Lymphs Abs 1.5  0.7 - 4.0 K/uL   Monocytes Relative 12  3 - 12 %   Monocytes Absolute 0.8  0.1 - 1.0 K/uL   Eosinophils Relative 2  0 - 5 %   Eosinophils Absolute 0.1  0.0 - 0.7 K/uL   Basophils Relative 0  0 - 1 %   Basophils Absolute 0.0  0.0 - 0.1 K/uL  COMPREHENSIVE METABOLIC PANEL      Component Value Range   Sodium 142  135 - 145 mEq/L   Potassium 4.7  3.5 - 5.1 mEq/L   Chloride 108  96 - 112 mEq/L   CO2 26  19 - 32 mEq/L   Glucose, Bld 69 (*) 70 - 99 mg/dL   BUN 52 (*) 6 - 23 mg/dL   Creatinine, Ser 5.36 (*) 0.50 - 1.35 mg/dL   Calcium 9.2  8.4 - 64.4 mg/dL   Total Protein 6.9  6.0 - 8.3 g/dL   Albumin 3.0 (*) 3.5 - 5.2 g/dL   AST 9  0 - 37 U/L   ALT  10  0 - 53 U/L   Alkaline Phosphatase 82  39 - 117 U/L   Total Bilirubin 0.2 (*) 0.3 - 1.2 mg/dL   GFR calc non Af Amer 28 (*) >90 mL/min   GFR calc Af Amer 33 (*) >90 mL/min  TROPONIN I      Component Value Range   Troponin I <0.30  <0.30 ng/mL  CG4 I-STAT (LACTIC ACID)      Component Value Range   Lactic Acid, Venous 1.71  0.5 - 2.2 mmol/L   Dg Chest 2 View  10/25/2012  *RADIOLOGY REPORT*  Clinical Data: Lightheaded  CHEST - 2 VIEW  Comparison: 12/14/2011  Findings: Poor inspiratory effect.  The patient is status post CABG.  Right diaphragm is elevated which is stable.  Heart size and vascular pattern are normal.  The lungs are free of infiltrate.  No pleural effusion.  There is  a 2 cm rounded opacity projecting over the left upper lobe.  The lateral image is limited by motion artifact but appears to demonstrate bilateral lower lobe atelectasis.  IMPRESSION: Rounded left upper lobe opacity.  This may represent an object overlying the patient's chest.  Consider repeating the frontal view of the thorax with attention to removing any overlying objects.   Original Report Authenticated By: Esperanza Heir, M.D.    Ct Head Wo Contrast  10/26/2012  *RADIOLOGY REPORT*  Clinical Data: Fall, head trauma.  CT HEAD WITHOUT CONTRAST  Technique:  Contiguous axial images were obtained from the base of the skull through the vertex without contrast.  Comparison: 05/20/2009  Findings: Prominence of the sulci, cisterns, and ventricles, in keeping with volume loss.  Bifrontal extra-axial space prominence. No high attenuation extra-axial fluid.  There are subcortical and periventricular white matter hypodensities, a nonspecific finding most often seen with chronic microangiopathic changes.  There is no evidence for acute hemorrhage, overt hydrocephalus, mass lesion.  No definite CT evidence for acute cortical based (large artery) infarction.  No displaced calvarial fracture. The visualized paranasal sinuses and mastoid  air cells are predominately clear.  IMPRESSION: Volume loss and white matter changes, similar to prior. No CT evidence for acute intracranial abnormality.   Original Report Authenticated By: Jearld Lesch, M.D.       Olivia Mackie, MD 10/26/12 [phone]

## 2012-10-25 NOTE — ED Notes (Signed)
PT. ARRIVED WITH EMS FROM HOME REPORTS NEAR SYNCOPAL EPISODE THIS EVENING WHILE TRYING TO TRANSFER FROM CHAIR TO BED , CBG = 107 , ALERT AND ORIENTED AT ARRIVAL ,  DENIES PAIN , RESPIRATIONS UNLABORED . HYPOTENSIVE AT SCENE  80'S SYSTOLIC .

## 2012-10-25 NOTE — ED Notes (Signed)
Pt resting quietly at this time with wife at bedside.  No complaints voiced.  Pt alert and oriented x's 3, skin warm and dry, color appropriate.

## 2012-10-26 ENCOUNTER — Encounter (HOSPITAL_COMMUNITY): Payer: Self-pay | Admitting: *Deleted

## 2012-10-26 DIAGNOSIS — R5383 Other fatigue: Secondary | ICD-10-CM

## 2012-10-26 DIAGNOSIS — E162 Hypoglycemia, unspecified: Secondary | ICD-10-CM | POA: Diagnosis present

## 2012-10-26 DIAGNOSIS — E1169 Type 2 diabetes mellitus with other specified complication: Secondary | ICD-10-CM

## 2012-10-26 DIAGNOSIS — R55 Syncope and collapse: Secondary | ICD-10-CM

## 2012-10-26 LAB — BASIC METABOLIC PANEL
CO2: 24 mEq/L (ref 19–32)
Chloride: 106 mEq/L (ref 96–112)
Potassium: 5.4 mEq/L — ABNORMAL HIGH (ref 3.5–5.1)
Sodium: 139 mEq/L (ref 135–145)

## 2012-10-26 LAB — URINALYSIS, ROUTINE W REFLEX MICROSCOPIC
Bilirubin Urine: NEGATIVE
Glucose, UA: NEGATIVE mg/dL
Ketones, ur: NEGATIVE mg/dL
Nitrite: NEGATIVE
Protein, ur: NEGATIVE mg/dL
pH: 5 (ref 5.0–8.0)

## 2012-10-26 LAB — CBC
HCT: 36.1 % — ABNORMAL LOW (ref 39.0–52.0)
Hemoglobin: 11.6 g/dL — ABNORMAL LOW (ref 13.0–17.0)
MCH: 23.5 pg — ABNORMAL LOW (ref 26.0–34.0)
MCHC: 32.1 g/dL (ref 30.0–36.0)
MCV: 73.1 fL — ABNORMAL LOW (ref 78.0–100.0)
Platelets: 170 K/uL (ref 150–400)
RBC: 4.94 MIL/uL (ref 4.22–5.81)
RDW: 18.5 % — ABNORMAL HIGH (ref 11.5–15.5)
WBC: 8 K/uL (ref 4.0–10.5)

## 2012-10-26 LAB — GLUCOSE, CAPILLARY
Glucose-Capillary: 112 mg/dL — ABNORMAL HIGH (ref 70–99)
Glucose-Capillary: 155 mg/dL — ABNORMAL HIGH (ref 70–99)
Glucose-Capillary: 171 mg/dL — ABNORMAL HIGH (ref 70–99)
Glucose-Capillary: 243 mg/dL — ABNORMAL HIGH (ref 70–99)

## 2012-10-26 LAB — BASIC METABOLIC PANEL WITH GFR
BUN: 50 mg/dL — ABNORMAL HIGH (ref 6–23)
CO2: 27 meq/L (ref 19–32)
Calcium: 9.4 mg/dL (ref 8.4–10.5)
Chloride: 107 meq/L (ref 96–112)
Creatinine, Ser: 2.05 mg/dL — ABNORMAL HIGH (ref 0.50–1.35)
GFR calc Af Amer: 35 mL/min — ABNORMAL LOW
GFR calc non Af Amer: 30 mL/min — ABNORMAL LOW
Glucose, Bld: 199 mg/dL — ABNORMAL HIGH (ref 70–99)
Potassium: 5.2 meq/L — ABNORMAL HIGH (ref 3.5–5.1)
Sodium: 143 meq/L (ref 135–145)

## 2012-10-26 LAB — TROPONIN I
Troponin I: <0.3 ng/mL (ref <0.30)
Troponin I: <0.3 ng/mL
Troponin I: <0.3 ng/mL

## 2012-10-26 MED ORDER — ACETAMINOPHEN 650 MG RE SUPP: 650.0000 mg | Freq: Four times a day (QID) | RECTAL | Status: DC | PRN

## 2012-10-26 MED ORDER — SIMVASTATIN 20 MG PO TABS
20.0000 mg | ORAL_TABLET | Freq: Every day | ORAL | Status: DC
  Administered 2012-10-26 – 2012-10-27 (×2): 20 mg via ORAL
  Filled 2012-10-26 (×3): qty 1

## 2012-10-26 MED ORDER — MORPHINE SULFATE 2 MG/ML IJ SOLN: 1.0000 mg | INTRAMUSCULAR | Status: DC | PRN

## 2012-10-26 MED ORDER — POLYETHYL GLYCOL-PROPYL GLYCOL 0.4-0.3 % OP SOLN: 1.0000 [drp] | Freq: Every day | OPHTHALMIC | Status: DC

## 2012-10-26 MED ORDER — ACETAMINOPHEN 325 MG PO TABS
650.0000 mg | ORAL_TABLET | Freq: Four times a day (QID) | ORAL | Status: DC | PRN
  Administered 2012-10-27: 650 mg via ORAL
  Filled 2012-10-26: qty 2

## 2012-10-26 MED ORDER — PANTOPRAZOLE SODIUM 40 MG PO TBEC
40.0000 mg | DELAYED_RELEASE_TABLET | Freq: Every day | ORAL | Status: DC
  Administered 2012-10-26 – 2012-10-28 (×3): 40 mg via ORAL
  Filled 2012-10-26 (×3): qty 1

## 2012-10-26 MED ORDER — OXYCODONE HCL 5 MG PO TABS
5.0000 mg | ORAL_TABLET | ORAL | Status: DC | PRN
  Administered 2012-10-27: 5 mg via ORAL
  Filled 2012-10-26: qty 1

## 2012-10-26 MED ORDER — TAMSULOSIN HCL 0.4 MG PO CAPS
0.4000 mg | ORAL_CAPSULE | Freq: Every day | ORAL | Status: DC
  Administered 2012-10-26 – 2012-10-28 (×3): 0.4 mg via ORAL
  Filled 2012-10-26 (×3): qty 1

## 2012-10-26 MED ORDER — HEPARIN SODIUM (PORCINE) 5000 UNIT/ML IJ SOLN
5000.0000 [IU] | Freq: Three times a day (TID) | INTRAMUSCULAR | Status: DC
  Administered 2012-10-26 – 2012-10-28 (×7): 5000 [IU] via SUBCUTANEOUS
  Filled 2012-10-26 (×10): qty 1

## 2012-10-26 MED ORDER — INSULIN ASPART 100 UNIT/ML ~~LOC~~ SOLN
0.0000 [IU] | Freq: Three times a day (TID) | SUBCUTANEOUS | Status: DC
Start: 2012-10-26 — End: 2012-10-28
  Administered 2012-10-26 (×2): 2 [IU] via SUBCUTANEOUS
  Administered 2012-10-26: 3 [IU] via SUBCUTANEOUS
  Administered 2012-10-27: 5 [IU] via SUBCUTANEOUS
  Administered 2012-10-27: 7 [IU] via SUBCUTANEOUS
  Administered 2012-10-27: 5 [IU] via SUBCUTANEOUS
  Administered 2012-10-28: 3 [IU] via SUBCUTANEOUS
  Administered 2012-10-28: 2 [IU] via SUBCUTANEOUS

## 2012-10-26 MED ORDER — ASPIRIN EC 325 MG PO TBEC
325.0000 mg | DELAYED_RELEASE_TABLET | Freq: Every day | ORAL | Status: DC
  Administered 2012-10-26: 325 mg via ORAL
  Filled 2012-10-26 (×2): qty 1

## 2012-10-26 MED ORDER — ISOSORBIDE MONONITRATE ER 30 MG PO TB24
30.0000 mg | ORAL_TABLET | Freq: Every day | ORAL | Status: DC
  Administered 2012-10-27 – 2012-10-28 (×2): 30 mg via ORAL
  Filled 2012-10-26 (×2): qty 1

## 2012-10-26 MED ORDER — ALBUTEROL SULFATE (5 MG/ML) 0.5% IN NEBU: 2.5000 mg | INHALATION_SOLUTION | Freq: Four times a day (QID) | RESPIRATORY_TRACT | Status: DC

## 2012-10-26 MED ORDER — SODIUM CHLORIDE 0.9 % IV SOLN
INTRAVENOUS | Status: DC
  Administered 2012-10-26: 03:00:00 via INTRAVENOUS
  Administered 2012-10-26: 75 mL/h via INTRAVENOUS
  Administered 2012-10-27 (×2): via INTRAVENOUS

## 2012-10-26 MED ORDER — ALBUTEROL SULFATE (5 MG/ML) 0.5% IN NEBU
2.5000 mg | INHALATION_SOLUTION | Freq: Four times a day (QID) | RESPIRATORY_TRACT | Status: DC
  Administered 2012-10-26 – 2012-10-27 (×5): 2.5 mg via RESPIRATORY_TRACT
  Filled 2012-10-26 (×5): qty 0.5

## 2012-10-26 MED ORDER — POLYVINYL ALCOHOL 1.4 % OP SOLN
1.0000 [drp] | Freq: Every day | OPHTHALMIC | Status: DC
  Administered 2012-10-26 – 2012-10-27 (×2): 1 [drp] via OPHTHALMIC
  Filled 2012-10-26 (×2): qty 15

## 2012-10-26 MED ORDER — ISOSORBIDE MONONITRATE ER 60 MG PO TB24
60.0000 mg | ORAL_TABLET | Freq: Every day | ORAL | Status: DC
  Administered 2012-10-26: 60 mg via ORAL
  Filled 2012-10-26: qty 1

## 2012-10-26 NOTE — Progress Notes (Signed)
Pt has not voided since 9PM prior to admission on 10/25/12 per pt report. Pt receiving NS @ 46ml/hr. Pt bladder scan is . Pt has no urge to void. MD notified no orders to cath at this point. Will continue to monitor and assess. Baron Hamper RN 10/26/2012 6:36 AM

## 2012-10-26 NOTE — Progress Notes (Signed)
10:17 AM I agree with HPI/GPe and A/P per Dr. Melven Sartorius of Dr. Jerelyn [facility], who saw him recently      Patient states he was trying to get from chair to bed yesterday and had an episode of dizzy ness and loc lasting a couple of seconds.  No tongue biting/incontinenece and no reported confusion after.  States his blood sugars have been erratic recently-he was hypoglycemic in the Ed and was given juice for this.  He has not been syncopal here-he states at baseline he can walk maybe a couple of steps but then get SOB with this minimal exertion   Chart review  Admission 3.17.13 for CAP  Admission 1.25.09 for weakness thought to be 2/2 to AKI  H/o EGD for ? Gastroparesis by Dr. Ewing Schlein 5.21.07=Proximal antritis  H/o Cardiac cath 2.26.03 by Dr. Mendel Ryder demonstrating severe LAD steosis-had subsequent CABG  HEENT-alert, morbidly obese.  Strabismus +.  No ict/pallor CHEST-clear no added sound CARDIAC-s1 s2 no m/r/g ABDOMEN-obese, NT/ND NEURO-grossly intact SKIN/MUSCULAR-no LE edema  Patient Active Problem List  Diagnosis  . Community acquired pneumonia  . Falls  . Chronic respiratory failure  . Obesity, morbid (more than 100 lbs over ideal weight or BMI > 40)  . CKD (chronic kidney disease) stage 3, GFR 30-59 ml/min  . COPD (chronic obstructive pulmonary disease) with acute bronchitis  . Physical deconditioning  . Obstructive sleep apnea  . Diabetes mellitus  . Hypertension  . Dyslipidemia  . Benign prostatic hypertrophy  . Gastroesophageal reflux disease  . Microcytic anemia  . Hypoglycemia  . Near syncope   1. Syncopy-Troponins cycled, Echo done, pending. Would cut back Imdur from 60-->30 mg 1.28.  Orthostatics neg.  Suspect this might be related to volume depletion-continue IVF 2. Aki-Baseline renal function eGFR 37-->27.  This might have played a role.  Lasix 40 held, Benazepril 20 bid both held on admit, and Imdur decreased as above 3. Erratic blood sugars-CBG yesterday  am about 69-given juice.  Lantus held given AKI.  Continue sensitive SSI.  Diabetic diet 4. Hyperkalemia-rpt K this am could be hemolysis.  If not would Rx  Rest as per Dr. Tamela Oddi, MD [facility] (P) [phone]

## 2012-10-26 NOTE — ED Notes (Signed)
Pt. States he sometimes can make a small amount of urine. Pt. Tried to urinate, no success.

## 2012-10-26 NOTE — ED Notes (Signed)
Admitting physician at bedside

## 2012-10-26 NOTE — Progress Notes (Signed)
Orth BP done, doc in epic 122/64   Lying  115/66 sitting  105./61 standing

## 2012-10-26 NOTE — Clinical Social Work Psychosocial (Signed)
     Clinical Social Work Department BRIEF PSYCHOSOCIAL ASSESSMENT 10/26/2012  Patient:  [name], [name]     Account Number:  [id number]     Admit date:  10/25/2012  Clinical Social Worker:  Tiburcio Pea  Date/Time:  10/26/2012 02:16 PM  Referred by:  Physician  Date Referred:  10/26/2012 Referred for  SNF Placement   Other Referral:   Interview type:  Other - See comment Other interview type:   Patient and by phone to his wife [name]    PSYCHOSOCIAL DATA Living Status:  WIFE Admitted from facility:   Level of care:   Primary support name:  [name]  [phone] Primary support relationship to patient:  SPOUSE Degree of support available:   Strong support    CURRENT CONCERNS Current Concerns  Post-Acute Placement   Other Concerns:    SOCIAL WORK ASSESSMENT / PLAN Met with patient and spoke to his wife [name] via phone. Patient has been referred for short term SNf as recommended by PT.  He has been a patient in the past at [facility]. This is their first choice;  patient agreed to full SNF search in [facility].  Fl2 placed on chart for MD's signature.  Will monitor for possible date of d/c per MD. he states that he is on a current list to be provided in home aide at home and hopes that this will be put into place asap.   Assessment/plan status:  Psychosocial Support/Ongoing Assessment of Needs Other assessment/ plan:   Information/referral to community resources:   SNF bed search given to patient    PATIENTS/FAMILYS RESPONSE TO PLAN OF CARE: Patient and wife agree to short term SNF.  Patient is anxious to get home and receive Home Health. His wife wants him at home when he is stronger.  They are aware of bed search process as he has been placed in the past. Wife will call [facility] to assist wiht placement.  CSW will monitor.

## 2012-10-26 NOTE — Progress Notes (Signed)
  Echocardiogram 2D Echocardiogram has been performed.  [name] 10/26/2012, 9:20 AM

## 2012-10-26 NOTE — Progress Notes (Signed)
Pt arrived to floor. A/Ox4. Pt VSS. Pt oriented to floor and room and verbalized understanding. Will continue to monitor and assess. Jobe Igo A RN 10/26/2012

## 2012-10-26 NOTE — H&P (Signed)
[facility]                                                                                    Patient Demographics  [name], is a 72 y.o. male  CSN: [id number]  MRN: [id number]  DOB - 09-12-1941  Admit Date - 10/25/2012  Outpatient Primary MD for the patient is Georgann Housekeeper, MD   With History of -  Past Medical History  Diagnosis Date  . Coronary artery disease   . Diabetes mellitus   . CHF (congestive heart failure)   . Stage III chronic kidney disease   . Arthritis   . COPD (chronic obstructive pulmonary disease)   . Chronic respiratory failure   . Obesity, Class III, BMI 40-49.9 (morbid obesity)   . Obstructive sleep apnea   . Gastroesophageal reflux   . Benign prostatic hypertrophy   . Hyperlipidemia   . Hypertension   . Microcytic anemia 12/16/2011  . CHF (congestive heart failure)       Past Surgical History  Procedure Date  . Back surgery   . Coronary artery bypass graft   . Cholecystectomy   . Tonsillectomy   . Cataract extraction     in for   Chief Complaint  Patient presents with  . Near Syncope     HPI  [name]  is a 72 y.o. male, with history of diabetes mellitus chronic renal failure presenting today with 2 weeks history of increasing weakness, dizziness and a near syncopal episode today witnessed by his wife. His wife reports that he did not respond for few seconds. Patient reports that at the control of his diabetes the last 2-3 weeks. He had a chest pain episode today radiating to left arm but not associated with any cold sweats. He had nausea and vomiting today, no fever or chills    Review of Systems    In addition to the HPI above,  No Fever-chills, No Headache, No changes with Vision or hearing, No problems swallowing food or Liquids,, No Abdominal pain, Bowel movements are regular, No Blood in stool or Urine, No dysuria, No new skin rashes or bruises, No new joints pains-aches,  No new weakness,  tingling, numbness in any extremity, No recent weight gain or loss, No polyuria, polydypsia or polyphagia, No significant Mental Stressors.  A full 10 point Review of Systems was done, except as stated above, all other Review of Systems were negative.   Social History History  Substance Use Topics  . Smoking status: Former Smoker -- 1.0 packs/day for 46 years    Types: Cigarettes    Quit date: 09/02/2001  . Smokeless tobacco: Not on file  . Alcohol Use: No     Family History Family History  Problem Relation Age of Onset  . Cancer     diabetes also runs in his family   Prior to Admission medications   Medication Sig Start Date End Date Taking? Authorizing Provider  benazepril (LOTENSIN) 20 MG tablet Take 20 mg by mouth 2 (two) times daily.   Yes Historical Provider, MD  FLUoxetine (PROZAC) 20 MG tablet Take 20 mg by mouth daily.   Yes Historical  Provider, MD  furosemide (LASIX) 40 MG tablet Take 40 mg by mouth daily as needed. For swelling   Yes Historical Provider, MD  gabapentin (NEURONTIN) 600 MG tablet Take 600 mg by mouth 4 (four) times daily.   Yes Historical Provider, MD  glimepiride (AMARYL) 2 MG tablet Take 2 mg by mouth daily before breakfast.   Yes Historical Provider, MD  insulin detemir (LEVEMIR) 100 UNIT/ML injection Inject 142 Units into the skin 2 (two) times daily.   Yes Historical Provider, MD  insulin lispro (HUMALOG) 100 UNIT/ML injection Inject 0-20 Units into the skin 3 (three) times daily as needed. Home sliding scale   Yes Historical Provider, MD  isosorbide mononitrate (IMDUR) 60 MG 24 hr tablet Take 60 mg by mouth daily.   Yes Historical Provider, MD  memantine (NAMENDA) 10 MG tablet Take 10 mg by mouth 2 (two) times daily.   Yes Historical Provider, MD  omeprazole (PRILOSEC) 40 MG capsule Take 40 mg by mouth daily.   Yes Historical Provider, MD  Polyethyl Glycol-Propyl Glycol (SYSTANE) 0.4-0.3 % SOLN Place 1 drop into the left eye daily.    Yes  Historical Provider, MD  PRESCRIPTION MEDICATION Apply to eye. 4 different eye drops   Yes Historical Provider, MD  simvastatin (ZOCOR) 20 MG tablet Take 20 mg by mouth at bedtime.   Yes Historical Provider, MD  Tamsulosin HCl (FLOMAX) 0.4 MG CAPS Take by mouth daily. One capsule 30 mins after the same meal each day   Yes Historical Provider, MD  oxyCODONE-acetaminophen (PERCOCET) 10-325 MG per tablet Take 1 tablet by mouth every 8 (eight) hours as needed. For pain.    Historical Provider, MD    No Known Allergies  Physical Exam  Vitals  Blood pressure 116/93, pulse 67, temperature 98.2 F (36.8 C), temperature source Oral, resp. rate 11, SpO2 100.00%.   1. General elderly African American male lying in bed tired.  2. Normal affect and insight, Not Suicidal or Homicidal, Awake Alert, Oriented X 3.  3. No F.N deficits, ALL C.Nerves Intact, Strength 5/5 all 4 extremities, Sensation intact all 4 extremities,  4. Ears and Eyes appear Normal arcus senilis noted, Conjunctivae clear, PERRLA. Moist Oral Mucosa.  5. Supple Neck, No JVD, No cervical lymphadenopathy appriciated, No Carotid Bruits.  6. Symmetrical Chest wall movement, Good air movement bilaterally, decreased breath sounds at the bases.  7. irregular, No Gallops, Rubs or Murmurs, No Parasternal Heave.  8. Positive Bowel Sounds, Abdomen Soft, Non tender, No organomegaly appriciated,No rebound -guarding or rigidity.  9.  No Cyanosis, Normal Skin Turgor, No Skin Rash or Bruise.  10. Good muscle tone,  joints appear normal , no effusions, Normal ROM.  11. No Palpable Lymph Nodes in Neck or Axillae    Data Review  CBC  Lab 10/25/12 2256  WBC 6.5  HGB 11.0*  HCT 34.9*  PLT 166  MCV 73.0*  MCH 23.0*  MCHC 31.5  RDW 18.4*  LYMPHSABS 1.5  MONOABS 0.8  EOSABS 0.1  BASOSABS 0.0  BANDABS --    ------------------------------------------------------------------------------------------------------------------  Chemistries   Lab 10/25/12 2256  NA 142  K 4.7  CL 108  CO2 26  GLUCOSE 69*  BUN 52*  CREATININE 2.18*  CALCIUM 9.2  MG --  AST 9  ALT 10  ALKPHOS 82  BILITOT 0.2*   ------------------------------------------------------------------------------------------------------------------ Cardiac Enzymes  Lab 10/25/12 2256  CKMB --  TROPONINI <0.30  MYOGLOBIN --        Imaging results:  Dg Chest 2 View  10/25/2012  *RADIOLOGY REPORT*  Clinical Data: Lightheaded  CHEST - 2 VIEW  Comparison: 12/14/2011  Findings: Poor inspiratory effect.  The patient is status post CABG.  Right diaphragm is elevated which is stable.  Heart size and vascular pattern are normal.  The lungs are free of infiltrate.  No pleural effusion.  There is a 2 cm rounded opacity projecting over the left upper lobe.  The lateral image is limited by motion artifact but appears to demonstrate bilateral lower lobe atelectasis.  IMPRESSION: Rounded left upper lobe opacity.  This may represent an object overlying the patient's chest.  Consider repeating the frontal view of the thorax with attention to removing any overlying objects.   Original Report Authenticated By: Esperanza Heir, M.D.    Ct Head Wo Contrast  10/26/2012  *RADIOLOGY REPORT*  Clinical Data: Fall, head trauma.  CT HEAD WITHOUT CONTRAST  Technique:  Contiguous axial images were obtained from the base of the skull through the vertex without contrast.  Comparison: 05/20/2009  Findings: Prominence of the sulci, cisterns, and ventricles, in keeping with volume loss.  Bifrontal extra-axial space prominence. No high attenuation extra-axial fluid.  There are subcortical and periventricular white matter hypodensities, a nonspecific finding most often seen with chronic microangiopathic changes.  There is no evidence for acute hemorrhage, overt  hydrocephalus, mass lesion.  No definite CT evidence for acute cortical based (large artery) infarction.  No displaced calvarial fracture. The visualized paranasal sinuses and mastoid air cells are predominately clear.  IMPRESSION: Volume loss and white matter changes, similar to prior. No CT evidence for acute intracranial abnormality.   Original Report Authenticated By: Jearld Lesch, M.D.     My personal review of EKG: Normal sinus rhythm at 77 beats per minute with PVCs and old inferior MI    Assessment & Plan  1. near-syncope with hypotension. CT of the head was negative for acute events 2. Hypoglycemia with erratic control of his diabetes 3. Chronic renal failure 4. Weakness worsening in the last few days 5. Chest pain episode today. Probably musculoskeletal  Plan IV fluids tonight Hold Lantus and Lasix tonight Check echo Serial troponins Neurochecks Social worker consult for placement if needed    DVT Prophylaxis Heparin   AM Labs Ordered, also please review Full Orders  Family Communication: Admission, patients condition and plan of care including tests being ordered have been discussed with the patient  who indicate understanding and agree with the plan and Code Status.  Code Status full  Disposition Plan: Admit to telemetry, discharge either home or to a nursing facility  Time spent in minutes : 45 minutes  Condition GUARDED

## 2012-10-26 NOTE — Progress Notes (Signed)
IV can regards to PICC. IV team stated difficultly getting blood draws is not a indication for a PICC, A mid line would be more apporate. Will inform Night nusre to tell day shift to address with MD in AM .

## 2012-10-26 NOTE — Evaluation (Signed)
Physical Therapy Evaluation Patient Details Name: [name] MRN: [id number] DOB: [birth year] Today's Date: 10/26/2012 Time: 7829-5621 PT Time Calculation (min): 32 min  PT Assessment / Plan / Recommendation Clinical Impression  72 y/o male admitted for syncopal episode with orthostasis and uncontrolled DM. Presents to PT today with below impairments affecting independence and safety with mobility. Will benefit physical therapy in the acute setting to maximize strength and independence for safe d/c plan. Rec ST-SNF in prep for safe d/c home.     PT Assessment  Patient needs continued PT services    Follow Up Recommendations  SNF    Does the patient have the potential to tolerate intense rehabilitation      Barriers to Discharge Decreased caregiver support family has limited capacity for physical assist (a lot of people with disabilities)    Equipment Recommendations  None recommended by PT    Recommendations for Other Services     Frequency Min 3X/week    Precautions / Restrictions Precautions Precautions: Fall Restrictions Weight Bearing Restrictions: No   Pertinent Vitals/Pain Orthostatic BPs (did not test standing but pt denies any dizziness throughout session)  Supine 129/74  Sitting 180/68  Sitting following session 137/62               Mobility  Bed Mobility Bed Mobility: Supine to Sit;Sitting - Scoot to Edge of Bed Supine to Sit: 4: Min assist;HOB elevated (30 degrees) Sitting - Scoot to Edge of Bed: 6: Modified independent (Device/Increase time) Details for Bed Mobility Assistance: min tactile assist for follow through during trunk elevation Transfers Transfers: Sit to Stand;Stand to Sit;Stand Pivot Transfers Sit to Stand: 4: Min assist;With upper extremity assist;From bed Stand to Sit: 4: Min assist;With upper extremity assist;To chair/3-in-1 Stand Pivot Transfers: 4: Min assist Details for Transfer Assistance: minA to bring trunk over BOS with cues  for sequencing and hand placement, minA for stability during power up and controlling descent to chair; min sequencing and stability assist during transfer, slow gaurded pace Ambulation/Gait Ambulation/Gait Assistance: Not tested (comment)         Exercises General Exercises - Lower Extremity Ankle Circles/Pumps: AROM;Both;10 reps;Supine   PT Diagnosis: Difficulty walking;Abnormality of gait;Generalized weakness  PT Problem List: Decreased strength;Decreased activity tolerance;Decreased balance;Decreased mobility;Obesity PT Treatment Interventions: DME instruction;Gait training;Functional mobility training;Therapeutic activities;Therapeutic exercise;Neuromuscular re-education;Balance training;Patient/family education   PT Goals Acute Rehab PT Goals PT Goal Formulation: With patient Time For Goal Achievement: 11/02/12 Potential to Achieve Goals: Good Pt will go Supine/Side to Sit: with modified independence PT Goal: Supine/Side to Sit - Progress: Goal set today Pt will go Sit to Supine/Side: with modified independence PT Goal: Sit to Supine/Side - Progress: Goal set today Pt will go Sit to Stand: with modified independence PT Goal: Sit to Stand - Progress: Goal set today Pt will go Stand to Sit: with modified independence PT Goal: Stand to Sit - Progress: Goal set today Pt will Transfer Bed to Chair/Chair to Bed: with modified independence PT Transfer Goal: Bed to Chair/Chair to Bed - Progress: Goal set today Pt will Stand: with modified independence;3 - 5 min;with bilateral upper extremity support PT Goal: Stand - Progress: Goal set today Pt will Ambulate: with modified independence;with least restrictive assistive device;51 - 150 feet PT Goal: Ambulate - Progress: Goal set today Pt will Perform Home Exercise Program: Independently PT Goal: Perform Home Exercise Program - Progress: Goal set today  Visit Information  Last PT Received On: 10/26/12 Assistance Needed: +1 (+2 for  safety  with ambultion) Reason Eval/Treat Not Completed: Patient at procedure or test/unavailable    Subjective Data  Subjective: I got dizzy yesterday.  Patient Stated Goal: home   Prior Functioning  Home Living Lives With: Spouse;Other (Comment) (nephew (recent toe/transmet amputation, limited assist)) Available Help at Discharge: Family;Available PRN/intermittently (most of his family is disabled) Type of Home: Apartment Home Access: Level entry Home Layout: One level Bathroom Shower/Tub: Tub/shower unit Home Adaptive Equipment: Tub transfer bench;Walker - rolling Prior Function Level of Independence: Independent with assistive device(s) (walks with walker, nephew normally follows him) Able to Take Stairs?: No Driving: No Vocation: Retired Comments: Lives with his wife who has had  stroke and can't use her left arm, nephew also stays with them but he has recently had some sort of food amputation, son works during the day and doesn't live with them and daughter also has diabetes with neuropathy (limited assistance) Communication Communication: No difficulties    Cognition  Overall Cognitive Status: Appears within functional limits for tasks assessed/performed Arousal/Alertness: Awake/alert Orientation Level: Appears intact for tasks assessed Behavior During Session: [facility] for tasks performed    Extremity/Trunk Assessment Right Upper Extremity Assessment RUE ROM/Strength/Tone: [facility] for tasks assessed Left Upper Extremity Assessment LUE ROM/Strength/Tone: WFL for tasks assessed Right Lower Extremity Assessment RLE ROM/Strength/Tone: Deficits RLE ROM/Strength/Tone Deficits: generally weak, needs upper extremities fors it->stnd as well as therapist assist Left Lower Extremity Assessment LLE ROM/Strength/Tone: Deficits LLE ROM/Strength/Tone Deficits: generally weak, needs upper extremities fors it->stnd as well as therapist assist Trunk Assessment Trunk Assessment: Normal     Balance Balance Balance Assessed: Yes Static Standing Balance Static Standing - Balance Support: Bilateral upper extremity supported Static Standing - Level of Assistance: 5: Stand by assistance  End of Session PT - End of Session Equipment Utilized During Treatment: Gait belt Activity Tolerance: Patient tolerated treatment well;Patient limited by fatigue Patient left: in chair;with call bell/phone within reach Nurse Communication: Mobility status  GP     [facility] [name] 10/26/2012, 10:39 AM

## 2012-10-26 NOTE — Progress Notes (Signed)
PT Cancellation Note  Patient Details Name: [name] MRN: [id number] DOB: Jan 11, 1941   Cancelled Treatment:    Reason Eval/Treat Not Completed: Patient at procedure or test/unavailable.   [name] 10/26/2012, 9:06 AM Pager: [phone]

## 2012-10-27 DIAGNOSIS — M549 Dorsalgia, unspecified: Secondary | ICD-10-CM | POA: Diagnosis present

## 2012-10-27 DIAGNOSIS — F0390 Unspecified dementia without behavioral disturbance: Secondary | ICD-10-CM | POA: Diagnosis present

## 2012-10-27 DIAGNOSIS — G629 Polyneuropathy, unspecified: Secondary | ICD-10-CM | POA: Diagnosis present

## 2012-10-27 LAB — GLUCOSE, CAPILLARY
Glucose-Capillary: 343 mg/dL — ABNORMAL HIGH (ref 70–99)
Glucose-Capillary: 229 mg/dL — ABNORMAL HIGH (ref 70–99)

## 2012-10-27 LAB — BASIC METABOLIC PANEL
BUN: 41 mg/dL — ABNORMAL HIGH (ref 6–23)
Calcium: 8.8 mg/dL (ref 8.4–10.5)
Creatinine, Ser: 1.75 mg/dL — ABNORMAL HIGH (ref 0.50–1.35)
GFR calc Af Amer: 43 mL/min — ABNORMAL LOW (ref >90)
GFR calc non Af Amer: 37 mL/min — ABNORMAL LOW (ref >90)
Potassium: 5.1 mEq/L (ref 3.5–5.1)

## 2012-10-27 MED ORDER — GABAPENTIN 300 MG PO CAPS
600.0000 mg | ORAL_CAPSULE | Freq: Three times a day (TID) | ORAL | Status: DC
  Administered 2012-10-27 – 2012-10-28 (×4): 600 mg via ORAL
  Filled 2012-10-27 (×6): qty 2

## 2012-10-27 MED ORDER — FLUOXETINE HCL 20 MG PO CAPS
20.0000 mg | ORAL_CAPSULE | Freq: Every day | ORAL | Status: DC
  Administered 2012-10-27 – 2012-10-28 (×2): 20 mg via ORAL
  Filled 2012-10-27 (×2): qty 1

## 2012-10-27 MED ORDER — MEMANTINE HCL 10 MG PO TABS
10.0000 mg | ORAL_TABLET | Freq: Two times a day (BID) | ORAL | Status: DC
  Administered 2012-10-27 – 2012-10-28 (×3): 10 mg via ORAL
  Filled 2012-10-27 (×4): qty 1

## 2012-10-27 MED ORDER — INSULIN DETEMIR 100 UNIT/ML ~~LOC~~ SOLN
30.0000 [IU] | Freq: Two times a day (BID) | SUBCUTANEOUS | Status: DC
  Administered 2012-10-27 (×2): 30 [IU] via SUBCUTANEOUS
  Filled 2012-10-27: qty 10

## 2012-10-27 MED ORDER — LATANOPROST 0.005 % OP SOLN
1.0000 [drp] | Freq: Every day | OPHTHALMIC | Status: DC
  Administered 2012-10-27 – 2012-10-28 (×2): 1 [drp] via OPHTHALMIC
  Filled 2012-10-27 (×2): qty 2.5

## 2012-10-27 MED ORDER — ALBUTEROL SULFATE (5 MG/ML) 0.5% IN NEBU
2.5000 mg | INHALATION_SOLUTION | Freq: Three times a day (TID) | RESPIRATORY_TRACT | Status: DC
  Administered 2012-10-27 – 2012-10-28 (×3): 2.5 mg via RESPIRATORY_TRACT
  Filled 2012-10-27 (×3): qty 0.5

## 2012-10-27 MED ORDER — POLYVINYL ALCOHOL 1.4 % OP SOLN
1.0000 [drp] | Freq: Three times a day (TID) | OPHTHALMIC | Status: DC
  Administered 2012-10-27 – 2012-10-28 (×3): 1 [drp] via OPHTHALMIC
  Filled 2012-10-27: qty 15

## 2012-10-27 MED ORDER — DORZOLAMIDE HCL-TIMOLOL MAL 2-0.5 % OP SOLN
1.0000 [drp] | Freq: Two times a day (BID) | OPHTHALMIC | Status: DC
  Administered 2012-10-27 – 2012-10-28 (×3): 1 [drp] via OPHTHALMIC
  Filled 2012-10-27: qty 10

## 2012-10-27 MED ORDER — INSULIN DETEMIR 100 UNIT/ML ~~LOC~~ SOLN: 45.0000 [IU] | Freq: Two times a day (BID) | SUBCUTANEOUS | Status: AC

## 2012-10-27 MED ORDER — ASPIRIN EC 81 MG PO TBEC
81.0000 mg | DELAYED_RELEASE_TABLET | Freq: Every day | ORAL | Status: DC
  Administered 2012-10-27 – 2012-10-28 (×2): 81 mg via ORAL
  Filled 2012-10-27 (×2): qty 1

## 2012-10-27 NOTE — Progress Notes (Signed)
Physical Therapy Treatment Patient Details Name: [name] MRN: [id number] DOB: 1941-06-06 Today's Date: 10/27/2012 Time: 7829-5621 PT Time Calculation (min): 24 min  PT Assessment / Plan / Recommendation Comments on Treatment Session  Admitted with syncopal episode. Progressing with therapy today but continues to demonstrate decreased activity tolerance and weakness affecting independence. Continue to recommend SNF.     Follow Up Recommendations  SNF     Does the patient have the potential to tolerate intense rehabilitation     Barriers to Discharge        Equipment Recommendations  None recommended by PT    Recommendations for Other Services    Frequency Min 3X/week   Plan Discharge plan remains appropriate;Frequency remains appropriate    Precautions / Restrictions Precautions Precautions: Fall Restrictions Weight Bearing Restrictions: No   Pertinent Vitals/Pain BP:  Supine: 137/53  Sitting: 120/84  **no c/o dizziness throughout session    Mobility  Bed Mobility Bed Mobility: Supine to Sit Supine to Sit: 4: Min guard;HOB elevated (45 degrees) Sitting - Scoot to Edge of Bed: 6: Modified independent (Device/Increase time) Details for Bed Mobility Assistance: increased effort utilizing elevated HOB and bed rails to maneuver today, cues for efficiency Transfers Transfers: Sit to Stand;Stand to Sit Sit to Stand: From elevated surface;From bed;With upper extremity assist Stand to Sit: 4: Min guard;To chair/3-in-1;With upper extremity assist Details for Transfer Assistance: sequencing cues/body positioning for ease of transfer, cues for hand placement, facilitation for anterior translation of trunk over BOS as well as power up; cues for hand placement and controlled descent to chair Ambulation/Gait Ambulation/Gait Assistance: 4: Min guard Ambulation Distance (Feet): 12 Feet Assistive device: Rolling walker Ambulation/Gait Assistance Details: cues for tall posture and  sequencing with RW, pt fatigues quickly needing chair follow; tends to push RW too far anteriorly with very wide stance    Exercises General Exercises - Lower Extremity Long Arc Quad: AROM;Both;10 reps;Seated Hip Flexion/Marching: AROM;Both;10 reps;Seated Toe Raises: AROM;Both;10 reps;Seated Heel Raises: AROM;Both;10 reps;Seated Shoulder Exercises Shoulder Flexion: AROM;Both;10 reps;Seated     PT Goals Acute Rehab PT Goals PT Goal: Supine/Side to Sit - Progress: Progressing toward goal PT Goal: Sit to Stand - Progress: Progressing toward goal PT Goal: Stand to Sit - Progress: Progressing toward goal PT Transfer Goal: Bed to Chair/Chair to Bed - Progress: Progressing toward goal PT Goal: Ambulate - Progress: Progressing toward goal PT Goal: Perform Home Exercise Program - Progress: Progressing toward goal  Visit Information  Last PT Received On: 10/27/12 Assistance Needed: +2    Subjective Data  Subjective: I needed help getting up yesterday.    Cognition  Overall Cognitive Status: Appears within functional limits for tasks assessed/performed Arousal/Alertness: Awake/alert Orientation Level: Appears intact for tasks assessed Behavior During Session: [facility] for tasks performed    Balance     End of Session PT - End of Session Equipment Utilized During Treatment: Gait belt Activity Tolerance: Patient tolerated treatment well Patient left: in chair;with call bell/phone within reach Nurse Communication: Mobility status   GP     [facility] [name] 10/27/2012, 11:09 AM

## 2012-10-27 NOTE — Progress Notes (Signed)
Subjective: Pt feel weak No hypoglycemia BP is better  Objective: Vital signs in last 24 hours: Temp:  [97.4 F (36.3 C)-98.3 F (36.8 C)] 98.3 F (36.8 C) (01/29 0430) Pulse Rate:  [62-96] 75  (01/29 0430) Resp:  [16-18] 18  (01/29 0430) BP: (105-129)/(60-74) 121/60 mmHg (01/29 0430) SpO2:  [98 %-100 %] 98 % (01/29 0856) Weight:  [138.9 kg (306 lb 3.5 oz)] 138.9 kg (306 lb 3.5 oz) (01/29 0430) Weight change: -0.9 kg (-1 lb 15.8 oz) Last BM Date: 10/26/12  Intake/Output from previous day: 01/28 0701 - 01/29 0700 In: 2122 [P.O.:922; I.V.:1200] Out: 1100 [Urine:1100] Intake/Output this shift: Total I/O In: 240 [P.O.:240] Out: -   General appearance: alert Resp: clear to auscultation bilaterally Cardio: regular rate and rhythm GI: soft, non-tender; bowel sounds normal; no masses,  no organomegaly Extremities: edema +1  Lab Results:  Basename 10/26/12 0305 10/25/12 2256  WBC 8.0 6.5  HGB 11.6* 11.0*  HCT 36.1* 34.9*  PLT 170 166   BMET  Basename 10/26/12 1015 10/26/12 0305  NA 143 139  K 5.2* 5.4*  CL 107 106  CO2 27 24  GLUCOSE 199* 135*  BUN 50* 52*  CREATININE 2.05* 2.06*  CALCIUM 9.4 9.3    Studies/Results: Dg Chest 2 View  10/25/2012  *RADIOLOGY REPORT*  Clinical Data: Lightheaded  CHEST - 2 VIEW  Comparison: 12/14/2011  Findings: Poor inspiratory effect.  The patient is status post CABG.  Right diaphragm is elevated which is stable.  Heart size and vascular pattern are normal.  The lungs are free of infiltrate.  No pleural effusion.  There is a 2 cm rounded opacity projecting over the left upper lobe.  The lateral image is limited by motion artifact but appears to demonstrate bilateral lower lobe atelectasis.  IMPRESSION: Rounded left upper lobe opacity.  This may represent an object overlying the patient's chest.  Consider repeating the frontal view of the thorax with attention to removing any overlying objects.   Original Report Authenticated By: Esperanza Heir, M.D.    Ct Head Wo Contrast  10/26/2012  *RADIOLOGY REPORT*  Clinical Data: Fall, head trauma.  CT HEAD WITHOUT CONTRAST  Technique:  Contiguous axial images were obtained from the base of the skull through the vertex without contrast.  Comparison: 05/20/2009  Findings: Prominence of the sulci, cisterns, and ventricles, in keeping with volume loss.  Bifrontal extra-axial space prominence. No high attenuation extra-axial fluid.  There are subcortical and periventricular white matter hypodensities, a nonspecific finding most often seen with chronic microangiopathic changes.  There is no evidence for acute hemorrhage, overt hydrocephalus, mass lesion.  No definite CT evidence for acute cortical based (large artery) infarction.  No displaced calvarial fracture. The visualized paranasal sinuses and mastoid air cells are predominately clear.  IMPRESSION: Volume loss and white matter changes, similar to prior. No CT evidence for acute intracranial abnormality.   Original Report Authenticated By: Jearld Lesch, M.D.     Medications: I have reviewed the patient's current medications.  Assessment/Plan: Presyncope/ falls/  Hypotension/ hypoglycemia BS is better- restart insulin at low dose and titrate No amaryl Hold Lasix- IVF  Recheck RF Acute on chronic kidney disease- baseline Cr 1.5-1.8 Hyperkalemia- recheck K DM: levemir 30 unit bid ( at home 50 unit) Dementia- namenda HTN: BP low- off ACE inhibitor CAD; negative CK; NSR on telemetry Neuropathy/ chronic pain back- weakness- PT therapy Gabapentin Depression, major- Prozac Obesity, morbid OSA- CPAP from home Decondition: SNF  Placement-  Would be ok to d/c  1-2 days; tomorrow if ok.   LOS: 2 days   Cyntha Brickman 10/27/2012, 9:55 AM

## 2012-10-27 NOTE — Progress Notes (Signed)
Utilization review completed.  P.J. Mellany Dinsmore,RN,BSN Case Manager [phone]  

## 2012-10-27 NOTE — Progress Notes (Signed)
Bed offers given to patient's wife for consideration. She continues to want [facility] but has to pay an outstanding balance there prior to their acceptance of patient. She is working with finances and the facility Production designer, theatre/television/film to determine if she can pay on the balance by Saturday. If not, she is requesting a bed at [facility]. They have made a bed offer and can accept patient when medially stable.  CSW will monitor.  [name]. [name]  [phone]

## 2012-10-27 NOTE — Progress Notes (Signed)
Patient evaluated for community based chronic disease management services with [facility] Care Management Program as a benefit of patient's [employer].  Spoke with patient and wife at length at bedside to explain [facility] Care Management services.  Patient is very concerned about his overall health however he has declined services at this time.  He would like to discuss services with his wife before making a decision.  Emphasized to patient and wife that if they chose to engage services post discharge from the SNF that they would need to contact our office directly to initiate services.  Both expressed understanding.  Left contact information and [facility] literature with wife. Of note, [facility] Care Management services does not replace or interfere with any services that are arranged by inpatient case management or social work.  For additional questions or referrals please contact Anibal Henderson BSN RN [facility] Liaison at [phone].

## 2012-10-27 NOTE — Clinical Social Work Placement (Addendum)
    Clinical Social Work Department CLINICAL SOCIAL WORK PLACEMENT NOTE 10/27/2012  Patient:  [name], [name]  Account Number:  [id number] Admit date:  10/25/2012  Clinical Social Worker:  Lupita Leash Avontae Burkhead, LCSWA  Date/time:  10/26/2012 02:30 PM  Clinical Social Work is seeking post-discharge placement for this patient at the following level of care:   SKILLED NURSING   (*CSW will update this form in Epic as items are completed)   10/26/2012  Patient/family provided with [facility] Department of Clinical Social Work's list of facilities offering this level of care within the geographic area requested by the patient (or if unable, by the patient's family).  10/26/2012  Patient/family informed of their freedom to choose among providers that offer the needed level of care, that participate in Medicare, Medicaid or managed care program needed by the patient, have an available bed and are willing to accept the patient.  10/26/2012  Patient/family informed of [facility]' ownership interest in [facility], as well as of the fact that they are under no obligation to receive care at this facility.  PASARR submitted to EDS on  PASARR number received from EDS on   FL2 transmitted to all facilities in geographic area requested by pt/family on  10/26/2012 FL2 transmitted to all facilities within larger geographic area on   Patient informed that his/her managed care company has contracts with or will negotiate with  certain facilities, including the following:  NA  Patient has existing PASARR number   Patient/family informed of bed offers received:  10/27/12 Patient chooses bed at [facility] Physician recommends and patient chooses bed at    Patient to be transferred to [facility]  on  10/28/12 Patient to be transferred to facility by [facility]ulance [facility])  The following physician request were entered in Epic:   Additional Comments: Patient and wife are very pleased with d/c plan to  [facility].  Notified SNF and pt's nurse- [name] of d/c.  No further CSW needs identified.  CSW signing off.  [name]. [name]  [phone]

## 2012-10-27 NOTE — Progress Notes (Signed)
Patient has a history of COPD came to hospital for increasing weakness and near syncopal episode per admission note.  Patient does not take any respiratory medications at home but is on 3L oxygen at home.  BBS are clear and diminished.  RT is changing treatments from Q6H to TID per protocol.

## 2012-10-28 LAB — GLUCOSE, CAPILLARY: Glucose-Capillary: 156 mg/dL — ABNORMAL HIGH (ref 70–99)

## 2012-10-28 MED ORDER — OXYCODONE-ACETAMINOPHEN 10-325 MG PO TABS: 1.0000 | ORAL_TABLET | Freq: Three times a day (TID) | ORAL | Status: DC | PRN

## 2012-10-28 MED ORDER — INSULIN DETEMIR 100 UNIT/ML ~~LOC~~ SOLN
45.0000 [IU] | Freq: Two times a day (BID) | SUBCUTANEOUS | Status: DC
  Administered 2012-10-28: 45 [IU] via SUBCUTANEOUS

## 2012-10-28 NOTE — Discharge Summary (Signed)
Physician Discharge Summary  Patient ID: [name] MRN: [id number] DOB/AGE: 74-Sep-1942 72 y.o.  Admit date: 10/25/2012 Discharge date: 10/28/2012  Admission Diagnoses:  Discharge Diagnoses:  Principal Problem:  *Near syncope Weakness/deconditioning Active Problems:  Hypoglycemia  Dementia  Neuropathy  Chronic back pain Diabetes with fluctuating blood sugars; neuropathy Chronic kidney disease acute and chronic renal failure Depression Hypertension CAD BPH Morbid obesity COPD Sleep apnea  Discharged Condition: good  Hospital Course: 72 year old male with multiple medical problems including, diabetes hypertension, CAD, morbid obesity chronicpain syndrome, severe neuropathy, decreased vision with legal blindness, cognitive recurrent-Admitted with fall and presyncope with low blood pressure episode of hypoglycemia Problem #1: low blood pressure,Presyncope: CT scan of the head negative; chest x-ray negative; patient was started on IV fluids his blood pressure medication Lotensin was. Blood pressure improved. He was also taking Lasix p.r.n. Which was also stopped. History of normal telemetry cardiac markers were negative. Problem#2: hypo-glycemia:   blood sugar improved emergency room. Because of use her insulin dose and poor p.o.blood sugar improved with the range of 200-300.;restart on insulin; glimepiride discontinued, continue sliding scale monitor  Sugars #3: weakness deconditioning , with multiple falls and gait abnormalities due to multifactorial because of chronic back pain osteoarthritis;neuropathy- physical therapy Hypertension blood pressure stable patient is off the Lotensin. Acute on chronic kidney disease, underlying chronic kidney disease with creatinine baseline of 1.5- 2.  IV fluid hydration creatinine baseline at 1.7 Urine was negative Chronic back neuropathy continue on pain medication History of dementia continue Namenda History of sleep apnea patient was not  using his CPAP he says he can tolerate Chronic anemia stable Constipationcontinue current medications MiraLax And Senokot Mild hyperkalemia resolved Glaucoma, poor vision continue   eyedrops Deconditioning and weakness/gait abnormality / fall: physical therapy Consults: None  Significant Diagnostic Studies: labs: blood chemistries normal,  normal white count, creatinine 1.7 cardiac markers negative UA negative and radiology: CXR: normal and CT scan: no acute finding CT of the head  Treatments: IV hydration  Discharge Exam: Blood pressure 125/69, pulse 61, temperature 98 F (36.7 C), temperature source Oral, resp. rate 18, height 6\' 2"  (1.88 m), weight 137.6 kg (303 lb 5.7 oz), SpO2 98.00%. General appearance: alert Resp: clear to auscultation bilaterally Cardio: regular rate and rhythm GI: soft, non-tender; bowel sounds normal; no masses,  no organomegaly Extremities: edema trace edema  Disposition: 03-Skilled Nursing Facility  Discharge Orders    Future Orders Please Complete By Expires   Diet - low sodium heart healthy      Increase activity slowly      Discharge instructions      Comments:   CPAP from Home- let his wife bring it       Medication List     As of 10/28/2012  8:21 AM    STOP taking these medications         benazepril 20 MG tablet   Commonly known as: LOTENSIN      furosemide 40 MG tablet   Commonly known as: LASIX      glimepiride 2 MG tablet   Commonly known as: AMARYL      TAKE these medications         dorzolamide-timolol 22.3-6.8 MG/ML ophthalmic solution   Commonly known as: COSOPT   Place 1 drop into the left eye 2 (two) times daily.      FLUoxetine 20 MG tablet   Commonly known as: PROZAC   Take 20 mg by mouth daily.  gabapentin 600 MG tablet   Commonly known as: NEURONTIN   Take 600 mg by mouth 4 (four) times daily.      insulin detemir 100 UNIT/ML injection   Commonly known as: LEVEMIR   Inject 45 Units into the skin 2  (two) times daily.      insulin lispro 100 UNIT/ML injection   Commonly known as: HUMALOG   Inject 0-20 Units into the skin 3 (three) times daily as needed. Home sliding scale      isosorbide mononitrate 60 MG 24 hr tablet   Commonly known as: IMDUR   Take 60 mg by mouth daily.      latanoprost 0.005 % ophthalmic solution   Commonly known as: XALATAN   Place 1 drop into the left eye daily.      memantine 10 MG tablet   Commonly known as: NAMENDA   Take 10 mg by mouth 2 (two) times daily.      omeprazole 40 MG capsule   Commonly known as: PRILOSEC   Take 40 mg by mouth daily.      oxyCODONE-acetaminophen 10-325 MG per tablet   Commonly known as: PERCOCET   Take 1 tablet by mouth every 8 (eight) hours as needed. For pain.      PRESCRIPTION MEDICATION   Apply to eye. 4 different eye drops      simvastatin 20 MG tablet   Commonly known as: ZOCOR   Take 20 mg by mouth at bedtime.      SYSTANE 0.4-0.3 % Soln   Generic drug: Polyethyl Glycol-Propyl Glycol   Place 1 drop into the left eye daily.      Tamsulosin HCl 0.4 MG Caps   Commonly known as: FLOMAX   Take by mouth daily. One capsule 30 mins after the same meal each day      total discharge planning time 40 minutes   Signed: Daizy Outen 10/28/2012, 8:21 AM

## 2012-10-28 NOTE — Progress Notes (Signed)
DC IV, Dc Tele, DC to SNF. Discharge instructions and home medications discussed with patient. Patient denied any questions or concerns at this time. Patient leaving unit via EMS and appears in no acute distress.

## 2013-02-25 IMAGING — US US RENAL
1 series · 14 of 22 positions shown · non-contrast
Comparison: CT of 12/03/2010.

CLINICAL DATA: Acute renal failure.  Rule out obstruction.

RENAL/URINARY TRACT ULTRASOUND COMPLETE

[Series 1: us renal · 0.37mm/px · 14 of 22 slices shown]
[im 1/22]
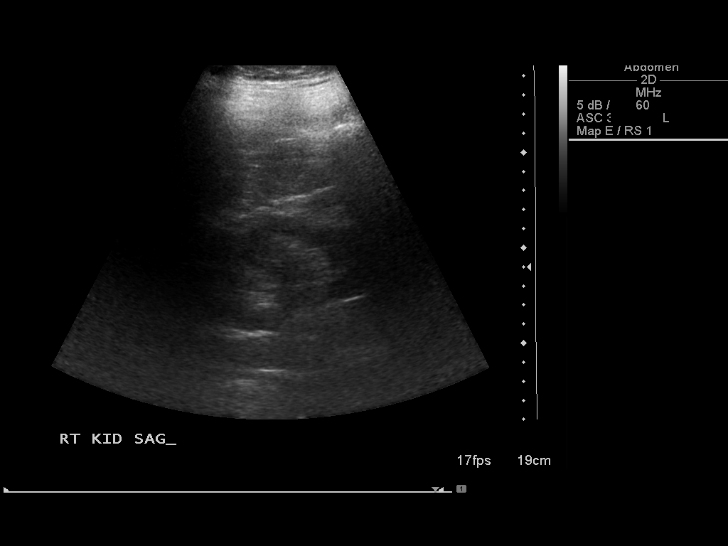
[im 3/22]
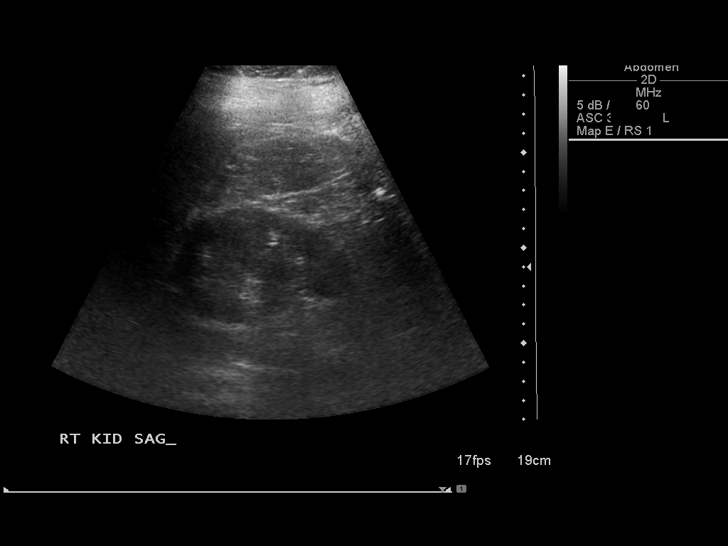
[im 4/22]
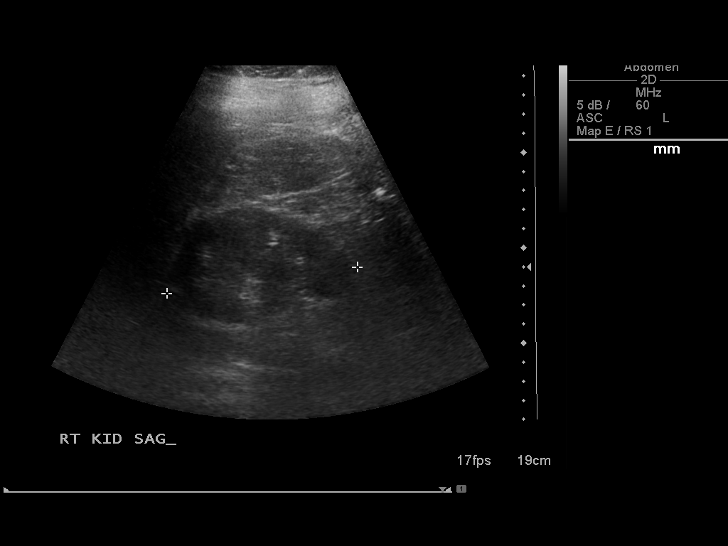
[im 6/22]
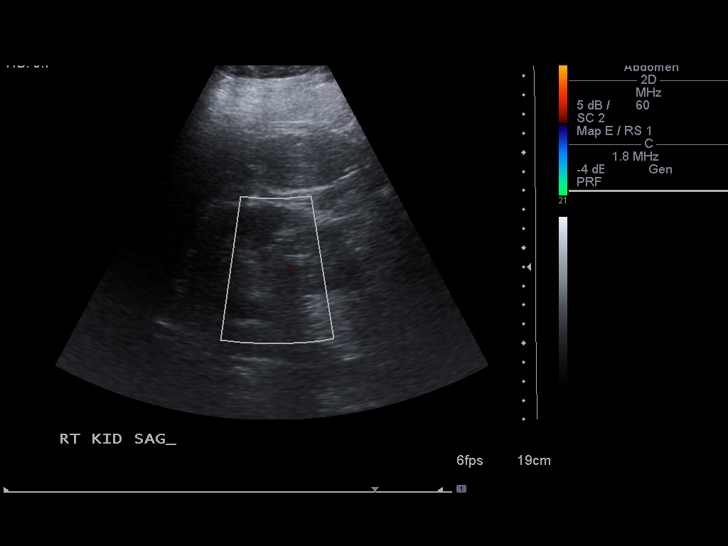
[im 8/22]
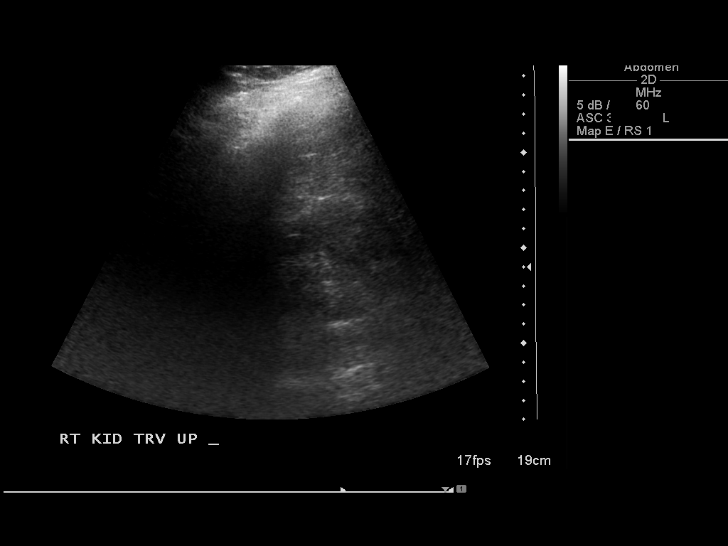
[im 9/22]
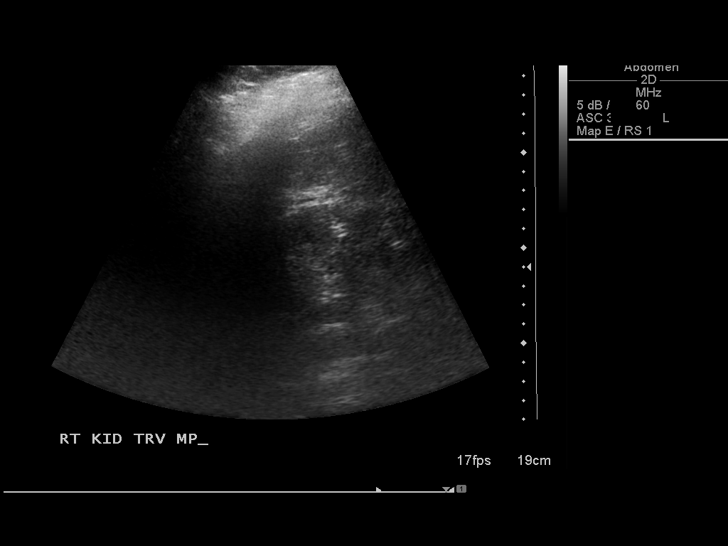
[im 11/22]
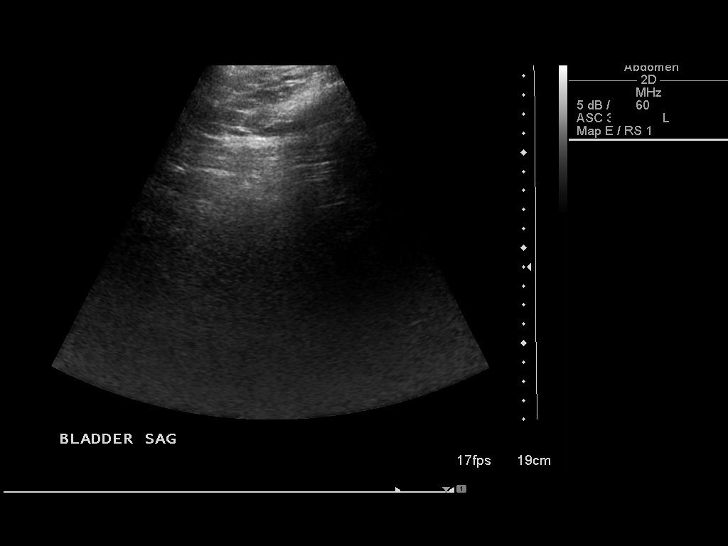
[im 12/22]
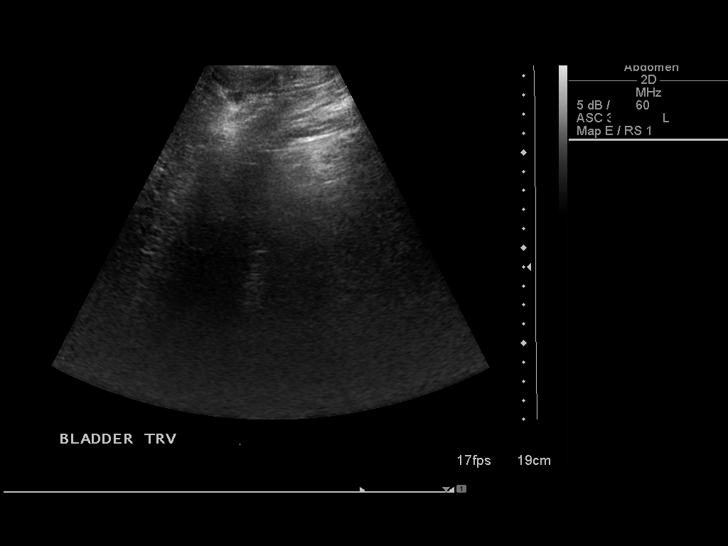
[im 14/22]
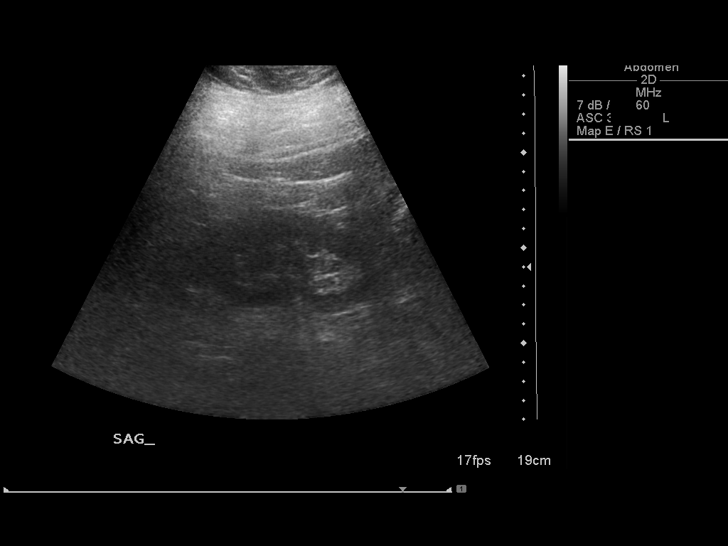
[im 15/22]
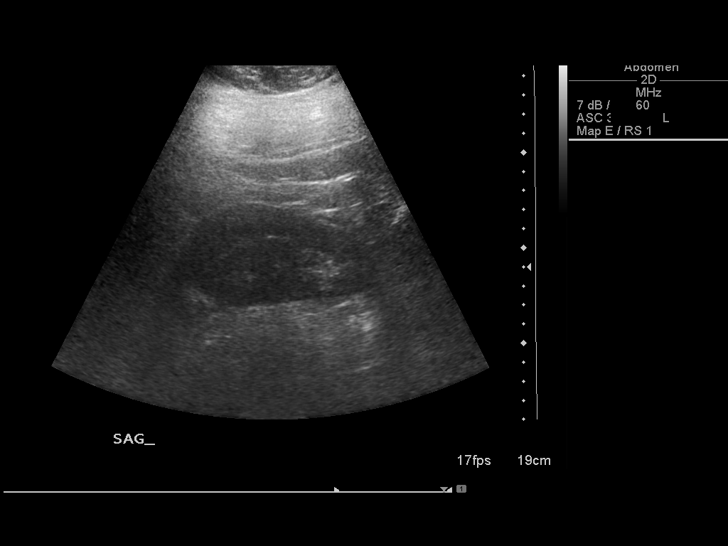
[im 17/22]
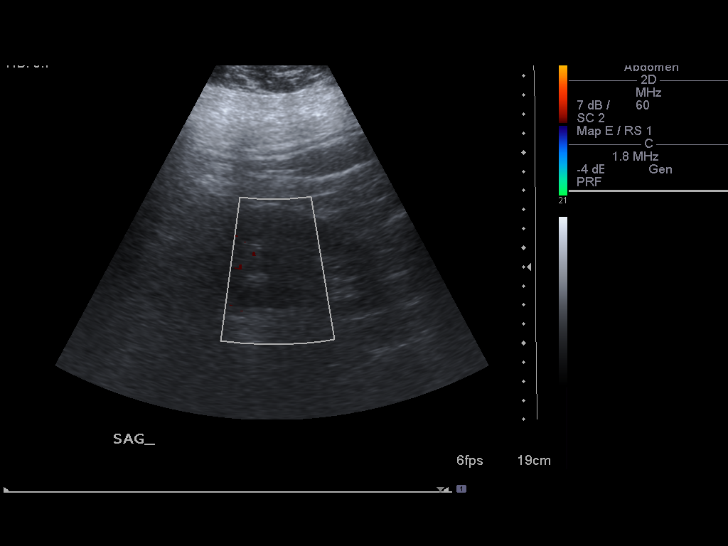
[im 19/22]
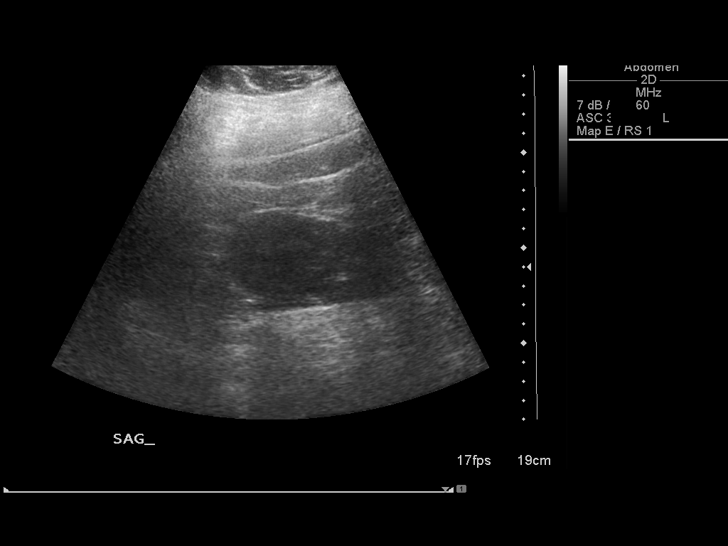
[im 20/22]
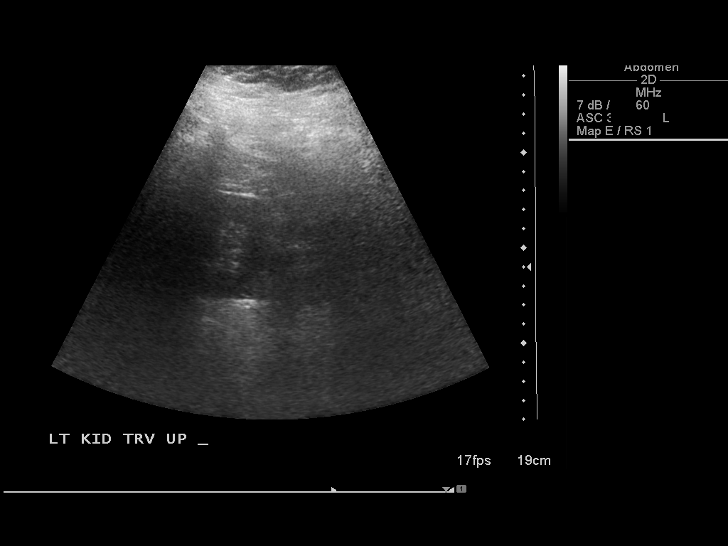
[im 22/22]
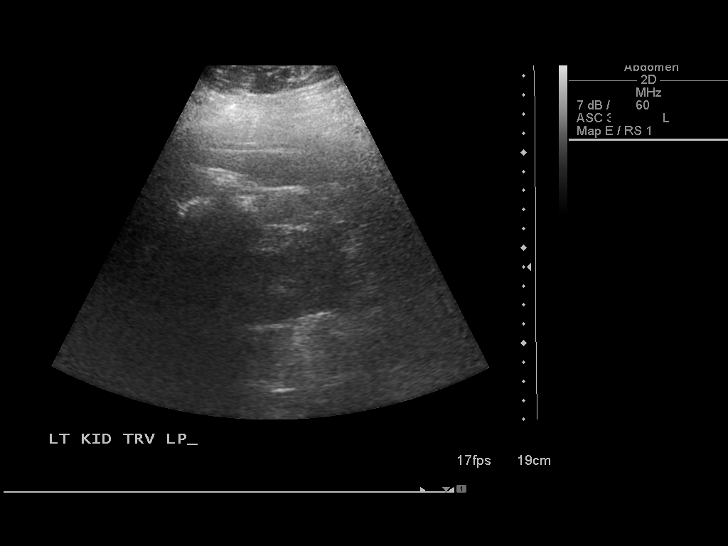

[14 of 22 positions shown; findings below may reference images not displayed]

FINDINGS: Right Kidney:  No hydronephrosis.  Mild renal cortical thinning.

Left Kidney:  10.8 cm. No hydronephrosis.  Mild renal cortical
thinning.

Bladder:  Collapsed.

Exam mildly degraded by patient body habitus.
IMPRESSION: No hydronephrosis.  Mild renal cortical thinning.

## 2013-02-27 ENCOUNTER — Emergency Department (HOSPITAL_COMMUNITY): Payer: Medicare Other

## 2013-02-27 ENCOUNTER — Encounter (HOSPITAL_COMMUNITY): Payer: Self-pay | Admitting: *Deleted

## 2013-02-27 ENCOUNTER — Emergency Department (HOSPITAL_COMMUNITY)
Admission: EM | Admit: 2013-02-27 | Discharge: 2013-02-27 | Disposition: A | Discharge status: Home / Self Care | Payer: Medicare Other | Attending: Emergency Medicine | Admitting: Emergency Medicine

## 2013-02-27 DIAGNOSIS — Z951 Presence of aortocoronary bypass graft: Secondary | ICD-10-CM | POA: Insufficient documentation

## 2013-02-27 DIAGNOSIS — J449 Chronic obstructive pulmonary disease, unspecified: Secondary | ICD-10-CM | POA: Insufficient documentation

## 2013-02-27 DIAGNOSIS — R143 Flatulence: Secondary | ICD-10-CM | POA: Insufficient documentation

## 2013-02-27 DIAGNOSIS — I129 Hypertensive chronic kidney disease with stage 1 through stage 4 chronic kidney disease, or unspecified chronic kidney disease: Secondary | ICD-10-CM | POA: Insufficient documentation

## 2013-02-27 DIAGNOSIS — Z87891 Personal history of nicotine dependence: Secondary | ICD-10-CM | POA: Insufficient documentation

## 2013-02-27 DIAGNOSIS — R142 Eructation: Secondary | ICD-10-CM | POA: Insufficient documentation

## 2013-02-27 DIAGNOSIS — J4489 Other specified chronic obstructive pulmonary disease: Secondary | ICD-10-CM | POA: Insufficient documentation

## 2013-02-27 DIAGNOSIS — M129 Arthropathy, unspecified: Secondary | ICD-10-CM | POA: Insufficient documentation

## 2013-02-27 DIAGNOSIS — R141 Gas pain: Secondary | ICD-10-CM | POA: Insufficient documentation

## 2013-02-27 DIAGNOSIS — Z794 Long term (current) use of insulin: Secondary | ICD-10-CM | POA: Insufficient documentation

## 2013-02-27 DIAGNOSIS — Z79899 Other long term (current) drug therapy: Secondary | ICD-10-CM | POA: Insufficient documentation

## 2013-02-27 DIAGNOSIS — E785 Hyperlipidemia, unspecified: Secondary | ICD-10-CM | POA: Insufficient documentation

## 2013-02-27 DIAGNOSIS — Z87442 Personal history of urinary calculi: Secondary | ICD-10-CM | POA: Insufficient documentation

## 2013-02-27 DIAGNOSIS — L97409 Non-pressure chronic ulcer of unspecified heel and midfoot with unspecified severity: Secondary | ICD-10-CM | POA: Insufficient documentation

## 2013-02-27 DIAGNOSIS — I2510 Atherosclerotic heart disease of native coronary artery without angina pectoris: Secondary | ICD-10-CM | POA: Insufficient documentation

## 2013-02-27 DIAGNOSIS — E119 Type 2 diabetes mellitus without complications: Secondary | ICD-10-CM | POA: Insufficient documentation

## 2013-02-27 DIAGNOSIS — R197 Diarrhea, unspecified: Secondary | ICD-10-CM | POA: Insufficient documentation

## 2013-02-27 DIAGNOSIS — G4733 Obstructive sleep apnea (adult) (pediatric): Secondary | ICD-10-CM | POA: Insufficient documentation

## 2013-02-27 DIAGNOSIS — K219 Gastro-esophageal reflux disease without esophagitis: Secondary | ICD-10-CM | POA: Insufficient documentation

## 2013-02-27 DIAGNOSIS — R300 Dysuria: Secondary | ICD-10-CM | POA: Insufficient documentation

## 2013-02-27 DIAGNOSIS — N183 Chronic kidney disease, stage 3 unspecified: Secondary | ICD-10-CM | POA: Insufficient documentation

## 2013-02-27 DIAGNOSIS — Z862 Personal history of diseases of the blood and blood-forming organs and certain disorders involving the immune mechanism: Secondary | ICD-10-CM | POA: Insufficient documentation

## 2013-02-27 DIAGNOSIS — Z9089 Acquired absence of other organs: Secondary | ICD-10-CM | POA: Insufficient documentation

## 2013-02-27 DIAGNOSIS — J9610 Chronic respiratory failure, unspecified whether with hypoxia or hypercapnia: Secondary | ICD-10-CM | POA: Insufficient documentation

## 2013-02-27 DIAGNOSIS — N400 Enlarged prostate without lower urinary tract symptoms: Secondary | ICD-10-CM | POA: Insufficient documentation

## 2013-02-27 DIAGNOSIS — I509 Heart failure, unspecified: Secondary | ICD-10-CM | POA: Insufficient documentation

## 2013-02-27 DIAGNOSIS — N390 Urinary tract infection, site not specified: Secondary | ICD-10-CM | POA: Insufficient documentation

## 2013-02-27 LAB — URINALYSIS, ROUTINE W REFLEX MICROSCOPIC
Bilirubin Urine: NEGATIVE
Glucose, UA: NEGATIVE mg/dL
Hgb urine dipstick: NEGATIVE
Ketones, ur: NEGATIVE mg/dL
Leukocytes, UA: NEGATIVE
Nitrite: POSITIVE — AB
Protein, ur: NEGATIVE mg/dL
Specific Gravity, Urine: 1.013 (ref 1.005–1.030)
Urobilinogen, UA: 1 mg/dL (ref 0.0–1.0)
pH: 6 (ref 5.0–8.0)

## 2013-02-27 LAB — CBC
HCT: 35.3 % — ABNORMAL LOW (ref 39.0–52.0)
Hemoglobin: 10.8 g/dL — ABNORMAL LOW (ref 13.0–17.0)
MCH: 23.4 pg — ABNORMAL LOW (ref 26.0–34.0)
MCHC: 30.6 g/dL (ref 30.0–36.0)
MCV: 76.4 fL — ABNORMAL LOW (ref 78.0–100.0)
Platelets: 210 10*3/uL (ref 150–400)
RBC: 4.62 MIL/uL (ref 4.22–5.81)
RDW: 16.5 % — ABNORMAL HIGH (ref 11.5–15.5)
WBC: 8 10*3/uL (ref 4.0–10.5)

## 2013-02-27 LAB — BASIC METABOLIC PANEL
BUN: 23 mg/dL (ref 6–23)
CO2: 31 mEq/L (ref 19–32)
Calcium: 8.9 mg/dL (ref 8.4–10.5)
Chloride: 101 mEq/L (ref 96–112)
Creatinine, Ser: 1.6 mg/dL — ABNORMAL HIGH (ref 0.50–1.35)
GFR calc Af Amer: 48 mL/min — ABNORMAL LOW (ref >90)
GFR calc non Af Amer: 41 mL/min — ABNORMAL LOW (ref >90)
Glucose, Bld: 125 mg/dL — ABNORMAL HIGH (ref 70–99)
Potassium: 5.3 mEq/L — ABNORMAL HIGH (ref 3.5–5.1)
Sodium: 141 mEq/L (ref 135–145)

## 2013-02-27 LAB — URINE MICROSCOPIC-ADD ON

## 2013-02-27 MED ORDER — DEXTROSE 5 % IV SOLN
1.0000 g | Freq: Once | INTRAVENOUS | Status: AC
  Administered 2013-02-27: 1 g via INTRAVENOUS
  Filled 2013-02-27: qty 10

## 2013-02-27 MED ORDER — CEPHALEXIN 250 MG PO CAPS: 250.0000 mg | ORAL_CAPSULE | Freq: Four times a day (QID) | ORAL | Status: DC

## 2013-02-27 MED ORDER — MORPHINE SULFATE 4 MG/ML IJ SOLN
4.0000 mg | Freq: Once | INTRAMUSCULAR | Status: AC
  Administered 2013-02-27: 4 mg via INTRAVENOUS
  Filled 2013-02-27: qty 1

## 2013-02-27 MED ORDER — IOHEXOL 300 MG/ML  SOLN
100.0000 mL | Freq: Once | INTRAMUSCULAR | Status: AC | PRN
  Administered 2013-02-27: 100 mL via INTRAVENOUS

## 2013-02-27 MED ORDER — IOHEXOL 300 MG/ML  SOLN
50.0000 mL | Freq: Once | INTRAMUSCULAR | Status: AC | PRN
  Administered 2013-02-27: 50 mL via ORAL

## 2013-02-27 MED ORDER — SODIUM CHLORIDE 0.9 % IV BOLUS (SEPSIS)
1000.0000 mL | Freq: Once | INTRAVENOUS | Status: AC
  Administered 2013-02-27: 1000 mL via INTRAVENOUS

## 2013-02-27 NOTE — ED Provider Notes (Signed)
Medical screening examination/treatment/procedure(s) were conducted as a shared visit with non-physician practitioner(s) and myself.  I personally evaluated the patient during the encounter  See my separate note   Richardean Canal, MD 02/27/13 2326

## 2013-02-27 NOTE — ED Notes (Signed)
No RLE edema, Cap RF WNL, Palpable pulses.

## 2013-02-27 NOTE — ED Notes (Signed)
Patient transported to CT 

## 2013-02-27 NOTE — ED Provider Notes (Signed)
History     CSN: [id number]  Arrival date & time 02/27/13  1638   First MD Initiated Contact with Patient 02/27/13 1718      Chief Complaint  Patient presents with  . Flank Pain    (Consider location/radiation/quality/duration/timing/severity/associated sxs/prior treatment) HPI  Past Medical History  Diagnosis Date  . Coronary artery disease   . Diabetes mellitus   . CHF (congestive heart failure)   . Stage III chronic kidney disease   . Arthritis   . COPD (chronic obstructive pulmonary disease)   . Chronic respiratory failure   . Obesity, Class III, BMI 40-49.9 (morbid obesity)   . Obstructive sleep apnea   . Gastroesophageal reflux   . Benign prostatic hypertrophy   . Hyperlipidemia   . Hypertension   . Microcytic anemia 12/16/2011  . CHF (congestive heart failure)     Past Surgical History  Procedure Laterality Date  . Back surgery    . Coronary artery bypass graft    . Cholecystectomy    . Tonsillectomy    . Cataract extraction      Family History  Problem Relation Age of Onset  . Cancer      History  Substance Use Topics  . Smoking status: Former Smoker -- 1.00 packs/day for 46 years    Types: Cigarettes    Quit date: 09/02/2001  . Smokeless tobacco: Not on file  . Alcohol Use: No      Review of Systems  Allergies  Review of patient's allergies indicates no known allergies.  Home Medications   Current Outpatient Rx  Name  Route  Sig  Dispense  Refill  . dorzolamide-timolol (COSOPT) 22.3-6.8 MG/ML ophthalmic solution   Left Eye   Place 1 drop into the left eye 2 (two) times daily.         Marland Kitchen FLUoxetine (PROZAC) 20 MG tablet   Oral   Take 20 mg by mouth daily.         Marland Kitchen gabapentin (NEURONTIN) 600 MG tablet   Oral   Take 600 mg by mouth 4 (four) times daily.         . insulin detemir (LEVEMIR) 100 UNIT/ML injection   Subcutaneous   Inject 45 Units into the skin 2 (two) times daily.   10 mL   3   . insulin lispro (HUMALOG)  100 UNIT/ML injection   Subcutaneous   Inject 0-20 Units into the skin 3 (three) times daily as needed. Home sliding scale         . isosorbide mononitrate (IMDUR) 60 MG 24 hr tablet   Oral   Take 60 mg by mouth daily.         Marland Kitchen latanoprost (XALATAN) 0.005 % ophthalmic solution   Left Eye   Place 1 drop into the left eye daily.         . memantine (NAMENDA) 10 MG tablet   Oral   Take 10 mg by mouth 2 (two) times daily.         Marland Kitchen omeprazole (PRILOSEC) 40 MG capsule   Oral   Take 40 mg by mouth daily.         Marland Kitchen oxyCODONE-acetaminophen (PERCOCET) 10-325 MG per tablet   Oral   Take 1 tablet by mouth every 8 (eight) hours as needed. For pain.   45 tablet   0   . Polyethyl Glycol-Propyl Glycol (SYSTANE) 0.4-0.3 % SOLN   Left Eye   Place 1 drop into the left eye  daily.          Marland Kitchen PRESCRIPTION MEDICATION   Ophthalmic   Apply to eye. 4 different eye drops         . simvastatin (ZOCOR) 20 MG tablet   Oral   Take 20 mg by mouth at bedtime.         . Tamsulosin HCl (FLOMAX) 0.4 MG CAPS   Oral   Take by mouth daily. One capsule 30 mins after the same meal each day         . cephALEXin (KEFLEX) 250 MG capsule   Oral   Take 1 capsule (250 mg total) by mouth 4 (four) times daily.   28 capsule   0     BP 146/70  Pulse 73  Temp(Src) 98.1 F (36.7 C) (Oral)  Resp 18  SpO2 100%  Physical Exam  ED Course  Procedures (including critical care time)  Labs Reviewed  CBC - Abnormal; Notable for the following:    Hemoglobin 10.8 (*)    HCT 35.3 (*)    MCV 76.4 (*)    MCH 23.4 (*)    RDW 16.5 (*)    All other components within normal limits  BASIC METABOLIC PANEL - Abnormal; Notable for the following:    Potassium 5.3 (*)    Glucose, Bld 125 (*)    Creatinine, Ser 1.60 (*)    GFR calc non Af Amer 41 (*)    GFR calc Af Amer 48 (*)    All other components within normal limits  URINALYSIS, ROUTINE W REFLEX MICROSCOPIC - Abnormal; Notable for the  following:    Nitrite POSITIVE (*)    All other components within normal limits  URINE MICROSCOPIC-ADD ON - Abnormal; Notable for the following:    Bacteria, UA MANY (*)    All other components within normal limits   Ct Abdomen Pelvis W Contrast  02/27/2013   *RADIOLOGY REPORT*  Clinical Data: Right-sided flank pain, dysuria  CT ABDOMEN AND PELVIS WITH CONTRAST  Technique:  Multidetector CT imaging of the abdomen and pelvis was performed following the standard protocol during bolus administration of intravenous contrast.  Contrast: OMNIPAQUE IOHEXOL 300 MG/ML  SOLN  Comparison: 12/03/2010  Findings: Evidence of CABG with discontiguous inferior sternal wires again noted.  Curvilinear bilateral lower lobe scarring again noted.  Cholecystectomy clips noted.  Liver, adrenal glands, kidneys, spleen, and pancreas are unremarkable.  Probable injection sites noted within the anterior abdominal wall subcutaneous tissues.  No lymphadenopathy, ascites, or free air.  Moderate atheromatous aortic calcification and areas of hypodense mural plaque noted. Left common iliac ectasia measures 1.8 cm image 69 and right common iliac arterial ectasia measures 1.6 cm at the same anatomic level.  4 mm left pelvic sidewall lymph node is stable.  No bowel wall thickening or focal segmental dilatation is identified.  The appendix is normal.  No radiopaque renal, ureteral, or bladder calculus.  No acute osseous abnormality.  Lumbar spine disc degenerative change is re-identified.  IMPRESSION: No acute intra-abdominal or pelvic pathology.   Original Report Authenticated By: Christiana Pellant, M.D.   Dg Foot Complete Right  02/27/2013   *RADIOLOGY REPORT*  Clinical Data: Diabetic foot ulcer, heel pain  RIGHT FOOT COMPLETE - 3+ VIEW  Comparison: None.  Findings: Hallux valgus deformity noted.  Soft tissue calcifications.   Minimal plantar calcaneal spurring.  No osseous destruction, fracture, or dislocation.  Oval radiopacity  projecting over the plantar surface soft tissues at the level  of the mid metatarsals is noted.  IMPRESSION: No acute osseous abnormality or bony destruction.  Oval radiopacity projecting over the soft tissues of the plantar aspect of the mid metatarsal level.  This could represent a phlebolith although foreign body could have a similar appearance.   Original Report Authenticated By: Christiana Pellant, M.D.     1. UTI (lower urinary tract infection)       MDM          Arman Filter, NP 02/27/13 2302

## 2013-02-27 NOTE — ED Notes (Signed)
Per ems: pt c/o flank pain that radiates to groin, began last night. Pain is constant and sharp, increases with urination. Denies any fall or injury to site. Hx of kidney stone which he had surgery for. Also c/o sore on right heal. Hx of DM, neuropathy, and demetia - is a+o per norm. bp 140/70 pulse 66, respirations 16. Pain 7/10 while resting, 10/10 with urination

## 2013-02-27 NOTE — ED Provider Notes (Signed)
Medical screening examination/treatment/procedure(s) were conducted as a shared visit with non-physician practitioner(s) and myself.  I personally evaluated the patient during the encounter  See my separate note   Richardean Canal, MD 02/27/13 [phone]

## 2013-02-27 NOTE — ED Provider Notes (Signed)
History     CSN: [id number]  Arrival date & time 02/27/13  1638   First MD Initiated Contact with Patient 02/27/13 1718      Chief Complaint  Patient presents with  . Flank Pain    (Consider location/radiation/quality/duration/timing/severity/associated sxs/prior treatment) HPI Pt is a 71yo  morbidly obese male with hx of DM, CHF, CKD, and BPH c/o right flank pain that was gradual in onset, constant and worsening, sharp pain that radiates to right groin, 7/10 that started last night.  Also c/o sudden on set burning pain 10/10 with urination, denies hematuria or penile discharge. PMH of kidney stones 10 years ago.  Does not know if pain is similar.  Has tried oxycodone for pain with some relief.  Pt also c/o gradual onset, worsening sharp pain in right heel.  States there is an ulcer and he is concerned b/c it hurts to bear weight and he has peripheral neuropathy.  Denies fever, nausea or vomiting.  Does report diarrhea x5-6 months.    Past Medical History  Diagnosis Date  . Coronary artery disease   . Diabetes mellitus   . CHF (congestive heart failure)   . Stage III chronic kidney disease   . Arthritis   . COPD (chronic obstructive pulmonary disease)   . Chronic respiratory failure   . Obesity, Class III, BMI 40-49.9 (morbid obesity)   . Obstructive sleep apnea   . Gastroesophageal reflux   . Benign prostatic hypertrophy   . Hyperlipidemia   . Hypertension   . Microcytic anemia 12/16/2011  . CHF (congestive heart failure)     Past Surgical History  Procedure Laterality Date  . Back surgery    . Coronary artery bypass graft    . Cholecystectomy    . Tonsillectomy    . Cataract extraction      Family History  Problem Relation Age of Onset  . Cancer      History  Substance Use Topics  . Smoking status: Former Smoker -- 1.00 packs/day for 46 years    Types: Cigarettes    Quit date: 09/02/2001  . Smokeless tobacco: Not on file  . Alcohol Use: No      Review of  Systems  Constitutional: Negative for fever and chills.  Respiratory: Negative for cough and shortness of breath.   Cardiovascular: Negative for chest pain.  Gastrointestinal: Positive for abdominal pain, diarrhea and abdominal distention. Negative for nausea, vomiting and constipation.  Genitourinary: Positive for dysuria and flank pain.  Skin: Positive for wound ( ulcer lateral portion right heel).  All other systems reviewed and are negative.    Allergies  Review of patient's allergies indicates no known allergies.  Home Medications   Current Outpatient Rx  Name  Route  Sig  Dispense  Refill  . dorzolamide-timolol (COSOPT) 22.3-6.8 MG/ML ophthalmic solution   Left Eye   Place 1 drop into the left eye 2 (two) times daily.         Marland Kitchen FLUoxetine (PROZAC) 20 MG tablet   Oral   Take 20 mg by mouth daily.         Marland Kitchen gabapentin (NEURONTIN) 600 MG tablet   Oral   Take 600 mg by mouth 4 (four) times daily.         . insulin detemir (LEVEMIR) 100 UNIT/ML injection   Subcutaneous   Inject 45 Units into the skin 2 (two) times daily.   10 mL   3   . insulin lispro (HUMALOG) 100  UNIT/ML injection   Subcutaneous   Inject 0-20 Units into the skin 3 (three) times daily as needed. Home sliding scale         . isosorbide mononitrate (IMDUR) 60 MG 24 hr tablet   Oral   Take 60 mg by mouth daily.         Marland Kitchen latanoprost (XALATAN) 0.005 % ophthalmic solution   Left Eye   Place 1 drop into the left eye daily.         . memantine (NAMENDA) 10 MG tablet   Oral   Take 10 mg by mouth 2 (two) times daily.         Marland Kitchen omeprazole (PRILOSEC) 40 MG capsule   Oral   Take 40 mg by mouth daily.         Marland Kitchen oxyCODONE-acetaminophen (PERCOCET) 10-325 MG per tablet   Oral   Take 1 tablet by mouth every 8 (eight) hours as needed. For pain.   45 tablet   0   . Polyethyl Glycol-Propyl Glycol (SYSTANE) 0.4-0.3 % SOLN   Left Eye   Place 1 drop into the left eye daily.          Marland Kitchen  PRESCRIPTION MEDICATION   Ophthalmic   Apply to eye. 4 different eye drops         . simvastatin (ZOCOR) 20 MG tablet   Oral   Take 20 mg by mouth at bedtime.         . Tamsulosin HCl (FLOMAX) 0.4 MG CAPS   Oral   Take by mouth daily. One capsule 30 mins after the same meal each day         . cephALEXin (KEFLEX) 250 MG capsule   Oral   Take 1 capsule (250 mg total) by mouth 4 (four) times daily.   28 capsule   0     BP 100/79  Pulse 72  Temp(Src) 97.7 F (36.5 C) (Oral)  Resp 20  SpO2 100%  Physical Exam  Constitutional: He appears well-developed and well-nourished. No distress.  Morbidly obese male resting comfortably on exam bed.   HENT:  Head: Normocephalic and atraumatic.  Eyes: Conjunctivae are normal. No scleral icterus.  Neck: Normal range of motion.  Cardiovascular: Normal rate, regular rhythm and normal heart sounds.   Pulmonary/Chest: Effort normal and breath sounds normal. No respiratory distress. He has no wheezes. He has no rales. He exhibits no tenderness.  Abdominal: Soft. Bowel sounds are normal. He exhibits no distension and no mass. There is tenderness ( diffuse, greater on Right side). There is no rebound and no guarding.  Musculoskeletal: Normal range of motion.       Right foot: He exhibits tenderness. He exhibits normal range of motion, no bony tenderness, no swelling, normal capillary refill, no crepitus, no deformity and no laceration.       Feet:  3cm scaling ulceration on lateral portion on right heal, TTP. No erythema warmth or red streaking.  No drainage.    Neurological: He is alert.  Skin: Skin is warm and dry. He is not diaphoretic.    ED Course  Procedures (including critical care time)  Labs Reviewed  CBC - Abnormal; Notable for the following:    Hemoglobin 10.8 (*)    HCT 35.3 (*)    MCV 76.4 (*)    MCH 23.4 (*)    RDW 16.5 (*)    All other components within normal limits  BASIC METABOLIC PANEL - Abnormal; Notable for  the following:    Potassium 5.3 (*)    Glucose, Bld 125 (*)    Creatinine, Ser 1.60 (*)    GFR calc non Af Amer 41 (*)    GFR calc Af Amer 48 (*)    All other components within normal limits  URINALYSIS, ROUTINE W REFLEX MICROSCOPIC - Abnormal; Notable for the following:    Nitrite POSITIVE (*)    All other components within normal limits  URINE MICROSCOPIC-ADD ON - Abnormal; Notable for the following:    Bacteria, UA MANY (*)    All other components within normal limits   Ct Abdomen Pelvis W Contrast  02/27/2013   *RADIOLOGY REPORT*  Clinical Data: Right-sided flank pain, dysuria  CT ABDOMEN AND PELVIS WITH CONTRAST  Technique:  Multidetector CT imaging of the abdomen and pelvis was performed following the standard protocol during bolus administration of intravenous contrast.  Contrast: OMNIPAQUE IOHEXOL 300 MG/ML  SOLN  Comparison: 12/03/2010  Findings: Evidence of CABG with discontiguous inferior sternal wires again noted.  Curvilinear bilateral lower lobe scarring again noted.  Cholecystectomy clips noted.  Liver, adrenal glands, kidneys, spleen, and pancreas are unremarkable.  Probable injection sites noted within the anterior abdominal wall subcutaneous tissues.  No lymphadenopathy, ascites, or free air.  Moderate atheromatous aortic calcification and areas of hypodense mural plaque noted. Left common iliac ectasia measures 1.8 cm image 69 and right common iliac arterial ectasia measures 1.6 cm at the same anatomic level.  4 mm left pelvic sidewall lymph node is stable.  No bowel wall thickening or focal segmental dilatation is identified.  The appendix is normal.  No radiopaque renal, ureteral, or bladder calculus.  No acute osseous abnormality.  Lumbar spine disc degenerative change is re-identified.  IMPRESSION: No acute intra-abdominal or pelvic pathology.   Original Report Authenticated By: Christiana Pellant, M.D.   Dg Foot Complete Right  02/27/2013   *RADIOLOGY REPORT*  Clinical Data:  Diabetic foot ulcer, heel pain  RIGHT FOOT COMPLETE - 3+ VIEW  Comparison: None.  Findings: Hallux valgus deformity noted.  Soft tissue calcifications.   Minimal plantar calcaneal spurring.  No osseous destruction, fracture, or dislocation.  Oval radiopacity projecting over the plantar surface soft tissues at the level of the mid metatarsals is noted.  IMPRESSION: No acute osseous abnormality or bony destruction.  Oval radiopacity projecting over the soft tissues of the plantar aspect of the mid metatarsal level.  This could represent a phlebolith although foreign body could have a similar appearance.   Original Report Authenticated By: Christiana Pellant, M.D.     1. UTI (lower urinary tract infection)       MDM  Pain sounds characteristic of renal stone, however due to pt's multiple comorbidities of  CHF, CKD, DM, and BPH will obtain labs and possible imaging.  CBC, BMP, UA. Started pt on fluid bolus and morphine.   CBC: consistent with pt's baseline BMP: consistent with pt's baseline  UA: positive for nitrites and many bacteria, will tx UTI in ED with 1g Rocephin. Plain film foot: no acute osseous abnormality or bony destruction  Discussed pt with Dr. Silverio Lay, will obtain abdominal CT due to UA being neg for hematuria.  Renal stone less likely for cause of pt's pain.    Signed out to [facility] NP at shift change.  CT abd pending.  Will discharge pt home with antibiotics for UTI if negative CT abd.  May need to admit depending on findings in CT if require surgery or  obs.  Have pt f/u with podiatry for foot ulcer.  Ulcer is superficial w/o bony involvement.        Junius Finner, PA-C 02/28/13 1456

## 2013-02-27 NOTE — ED Provider Notes (Signed)
  Physical Exam  BP 146/70  Pulse 73  Temp(Src) 98.1 F (36.7 C) (Oral)  Resp 18  SpO2 100%  Physical Exam Patient awaiting result of CTScan   Normal will DC home wioth Dx of UTI and Rx for Keflex  ED Course  Procedures  MDM       Arman Filter, NP 02/27/13 2257

## 2013-03-02 NOTE — ED Provider Notes (Signed)
Medical screening examination/treatment/procedure(s) were conducted as a shared visit with non-physician practitioner(s) and myself.  I personally evaluated the patient during the encounter  [name] is a 72 y.o. male hx of DM, CHF, CKD here with R flank pain. Hx of kidney stones but this is different than his kidney stone pain. + RUQ and RLQ tenderness. UA + UTI. CT nl. D/c home on antibiotics.    Richardean Canal, MD 03/02/13 [phone]

## 2013-07-05 ENCOUNTER — Emergency Department (HOSPITAL_COMMUNITY): Payer: Medicare Other

## 2013-07-05 ENCOUNTER — Inpatient Hospital Stay (HOSPITAL_COMMUNITY)
Admission: EM | Admit: 2013-07-05 | Discharge: 2013-07-08 | DRG: 641 | Disposition: A | Discharge status: Skilled Nursing Facility | Payer: Medicare Other | Attending: Internal Medicine | Admitting: Internal Medicine

## 2013-07-05 ENCOUNTER — Encounter (HOSPITAL_COMMUNITY): Payer: Self-pay | Admitting: Internal Medicine

## 2013-07-05 DIAGNOSIS — Z87891 Personal history of nicotine dependence: Secondary | ICD-10-CM

## 2013-07-05 DIAGNOSIS — M549 Dorsalgia, unspecified: Secondary | ICD-10-CM

## 2013-07-05 DIAGNOSIS — I714 Abdominal aortic aneurysm, without rupture, unspecified: Secondary | ICD-10-CM | POA: Diagnosis present

## 2013-07-05 DIAGNOSIS — I2510 Atherosclerotic heart disease of native coronary artery without angina pectoris: Secondary | ICD-10-CM | POA: Diagnosis present

## 2013-07-05 DIAGNOSIS — N289 Disorder of kidney and ureter, unspecified: Secondary | ICD-10-CM

## 2013-07-05 DIAGNOSIS — N400 Enlarged prostate without lower urinary tract symptoms: Secondary | ICD-10-CM | POA: Diagnosis present

## 2013-07-05 DIAGNOSIS — G8929 Other chronic pain: Secondary | ICD-10-CM | POA: Diagnosis present

## 2013-07-05 DIAGNOSIS — N179 Acute kidney failure, unspecified: Secondary | ICD-10-CM | POA: Diagnosis present

## 2013-07-05 DIAGNOSIS — E119 Type 2 diabetes mellitus without complications: Secondary | ICD-10-CM | POA: Diagnosis present

## 2013-07-05 DIAGNOSIS — E1142 Type 2 diabetes mellitus with diabetic polyneuropathy: Secondary | ICD-10-CM | POA: Diagnosis present

## 2013-07-05 DIAGNOSIS — Z951 Presence of aortocoronary bypass graft: Secondary | ICD-10-CM

## 2013-07-05 DIAGNOSIS — E785 Hyperlipidemia, unspecified: Secondary | ICD-10-CM | POA: Diagnosis present

## 2013-07-05 DIAGNOSIS — I5040 Unspecified combined systolic (congestive) and diastolic (congestive) heart failure: Secondary | ICD-10-CM | POA: Diagnosis present

## 2013-07-05 DIAGNOSIS — I509 Heart failure, unspecified: Secondary | ICD-10-CM | POA: Diagnosis present

## 2013-07-05 DIAGNOSIS — F3289 Other specified depressive episodes: Secondary | ICD-10-CM | POA: Diagnosis present

## 2013-07-05 DIAGNOSIS — I10 Essential (primary) hypertension: Secondary | ICD-10-CM

## 2013-07-05 DIAGNOSIS — M1990 Unspecified osteoarthritis, unspecified site: Secondary | ICD-10-CM | POA: Diagnosis present

## 2013-07-05 DIAGNOSIS — Z6841 Body Mass Index (BMI) 40.0 and over, adult: Secondary | ICD-10-CM

## 2013-07-05 DIAGNOSIS — D631 Anemia in chronic kidney disease: Secondary | ICD-10-CM | POA: Diagnosis present

## 2013-07-05 DIAGNOSIS — M25559 Pain in unspecified hip: Secondary | ICD-10-CM | POA: Diagnosis present

## 2013-07-05 DIAGNOSIS — N184 Chronic kidney disease, stage 4 (severe): Secondary | ICD-10-CM | POA: Diagnosis present

## 2013-07-05 DIAGNOSIS — Y92009 Unspecified place in unspecified non-institutional (private) residence as the place of occurrence of the external cause: Secondary | ICD-10-CM

## 2013-07-05 DIAGNOSIS — R197 Diarrhea, unspecified: Secondary | ICD-10-CM

## 2013-07-05 DIAGNOSIS — J4489 Other specified chronic obstructive pulmonary disease: Secondary | ICD-10-CM | POA: Diagnosis present

## 2013-07-05 DIAGNOSIS — D509 Iron deficiency anemia, unspecified: Secondary | ICD-10-CM | POA: Diagnosis present

## 2013-07-05 DIAGNOSIS — K219 Gastro-esophageal reflux disease without esophagitis: Secondary | ICD-10-CM | POA: Diagnosis present

## 2013-07-05 DIAGNOSIS — F329 Major depressive disorder, single episode, unspecified: Secondary | ICD-10-CM | POA: Diagnosis present

## 2013-07-05 DIAGNOSIS — Z794 Long term (current) use of insulin: Secondary | ICD-10-CM

## 2013-07-05 DIAGNOSIS — J449 Chronic obstructive pulmonary disease, unspecified: Secondary | ICD-10-CM | POA: Diagnosis present

## 2013-07-05 DIAGNOSIS — R5381 Other malaise: Secondary | ICD-10-CM

## 2013-07-05 DIAGNOSIS — W1811XA Fall from or off toilet without subsequent striking against object, initial encounter: Secondary | ICD-10-CM | POA: Diagnosis present

## 2013-07-05 DIAGNOSIS — Z9981 Dependence on supplemental oxygen: Secondary | ICD-10-CM

## 2013-07-05 DIAGNOSIS — J9610 Chronic respiratory failure, unspecified whether with hypoxia or hypercapnia: Secondary | ICD-10-CM | POA: Diagnosis present

## 2013-07-05 DIAGNOSIS — G4733 Obstructive sleep apnea (adult) (pediatric): Secondary | ICD-10-CM | POA: Diagnosis present

## 2013-07-05 DIAGNOSIS — E1149 Type 2 diabetes mellitus with other diabetic neurological complication: Secondary | ICD-10-CM | POA: Diagnosis present

## 2013-07-05 DIAGNOSIS — R109 Unspecified abdominal pain: Secondary | ICD-10-CM

## 2013-07-05 DIAGNOSIS — K5289 Other specified noninfective gastroenteritis and colitis: Secondary | ICD-10-CM | POA: Diagnosis present

## 2013-07-05 DIAGNOSIS — D649 Anemia, unspecified: Secondary | ICD-10-CM

## 2013-07-05 DIAGNOSIS — R531 Weakness: Secondary | ICD-10-CM | POA: Diagnosis present

## 2013-07-05 DIAGNOSIS — R1110 Vomiting, unspecified: Secondary | ICD-10-CM | POA: Diagnosis present

## 2013-07-05 DIAGNOSIS — W19XXXA Unspecified fall, initial encounter: Secondary | ICD-10-CM

## 2013-07-05 DIAGNOSIS — K5900 Constipation, unspecified: Secondary | ICD-10-CM | POA: Diagnosis present

## 2013-07-05 DIAGNOSIS — M4800 Spinal stenosis, site unspecified: Secondary | ICD-10-CM | POA: Diagnosis present

## 2013-07-05 DIAGNOSIS — H548 Legal blindness, as defined in USA: Secondary | ICD-10-CM | POA: Diagnosis present

## 2013-07-05 DIAGNOSIS — R55 Syncope and collapse: Secondary | ICD-10-CM | POA: Diagnosis present

## 2013-07-05 DIAGNOSIS — I129 Hypertensive chronic kidney disease with stage 1 through stage 4 chronic kidney disease, or unspecified chronic kidney disease: Secondary | ICD-10-CM | POA: Diagnosis present

## 2013-07-05 DIAGNOSIS — E860 Dehydration: Principal | ICD-10-CM | POA: Diagnosis present

## 2013-07-05 LAB — OCCULT BLOOD, POC DEVICE: Fecal Occult Bld: NEGATIVE

## 2013-07-05 LAB — CBC WITH DIFFERENTIAL/PLATELET
Basophils Absolute: 0 10*3/uL (ref 0.0–0.1)
Eosinophils Absolute: 0.1 10*3/uL (ref 0.0–0.7)
Lymphocytes Relative: 26 % (ref 12–46)
MCHC: 31.5 g/dL (ref 30.0–36.0)
Neutro Abs: 6.2 10*3/uL (ref 1.7–7.7)
Neutrophils Relative %: 63 % (ref 43–77)
RDW: 19.7 % — ABNORMAL HIGH (ref 11.5–15.5)

## 2013-07-05 LAB — COMPREHENSIVE METABOLIC PANEL
AST: 6 U/L (ref 0–37)
CO2: 24 mEq/L (ref 19–32)
Calcium: 8.7 mg/dL (ref 8.4–10.5)
Creatinine, Ser: 2.41 mg/dL — ABNORMAL HIGH (ref 0.50–1.35)
GFR calc non Af Amer: 25 mL/min — ABNORMAL LOW (ref >90)

## 2013-07-05 LAB — URINALYSIS, ROUTINE W REFLEX MICROSCOPIC
Hgb urine dipstick: NEGATIVE
Leukocytes, UA: NEGATIVE
Nitrite: NEGATIVE
Specific Gravity, Urine: 1.018 (ref 1.005–1.030)
Urobilinogen, UA: 1 mg/dL (ref 0.0–1.0)

## 2013-07-05 LAB — APTT: aPTT: 26 seconds (ref 24–37)

## 2013-07-05 LAB — PROTIME-INR: Prothrombin Time: 13.8 seconds (ref 11.6–15.2)

## 2013-07-05 MED ORDER — IOHEXOL 300 MG/ML  SOLN
25.0000 mL | INTRAMUSCULAR | Status: AC
  Administered 2013-07-05: 25 mL via ORAL

## 2013-07-05 MED ORDER — SODIUM CHLORIDE 0.9 % IV SOLN
INTRAVENOUS | Status: DC
  Administered 2013-07-05: 14:00:00 via INTRAVENOUS

## 2013-07-05 NOTE — ED Notes (Signed)
Primary RN made aware after report called that pt had lost IV. RN attempted x 2. IV team paged. Floor made aware on pt delay.

## 2013-07-05 NOTE — ED Provider Notes (Signed)
CSN: [id number]     Arrival date & time 07/05/13  1256 History   First MD Initiated Contact with Patient 07/05/13 1306     Chief Complaint  Patient presents with  . Fall  . Abdominal Pain   (Consider location/radiation/quality/duration/timing/severity/associated sxs/prior Treatment) HPI Patient reports he started getting weakness about a week ago. He states he feels weak all the time and it's not just when he stands up or walks. He states he's had a headache for the past month. He states it's on the left side and lasts about a minute. He states he has chronic blurred vision. He states he's been having some lower central chest discomfort the past few nights that is sharp and will only last a few seconds. He states he has chronic shortness of breath and wears oxygen 3 L per minute nasal cannula all the time. He states he has noticed swelling of his abdomen for the past 2 days and has some discomfort in the middle and right lower abdomen. He states his abdomen is not painful but feels tight. He states he's never had it before. He denies nausea, vomiting, or diarrhea. He however states he did have diarrhea during the night and had incontinence in bed. He denies GI bleeding. He states he has a "weak bladder" and he urinates all the time. He states this morning he was sitting on the toilet and he got extremely weak and felt like he was going to black out but he did fall off the toilet. He denies having loss of consciousness. He states he hit his head and he now has neck pain.  PCP Dr. Eula Listen Cardiologist unknown  Past Medical History  Diagnosis Date  . Coronary artery disease   . Diabetes mellitus   . CHF (congestive heart failure)   . Stage III chronic kidney disease   . Arthritis   . COPD (chronic obstructive pulmonary disease)   . Chronic respiratory failure   . Obesity, Class III, BMI 40-49.9 (morbid obesity)   . Obstructive sleep apnea   . Gastroesophageal reflux   . Benign prostatic  hypertrophy   . Hyperlipidemia   . Hypertension   . Microcytic anemia 12/16/2011  . CHF (congestive heart failure)    Past Surgical History  Procedure Laterality Date  . Back surgery    . Coronary artery bypass graft    . Cholecystectomy    . Tonsillectomy    . Cataract extraction     Family History  Problem Relation Age of Onset  . Cancer     History  Substance Use Topics  . Smoking status: Former Smoker -- 1.00 packs/day for 46 years    Types: Cigarettes    Quit date: 09/02/2001  . Smokeless tobacco: Not on file  . Alcohol Use: No  lives at home Lives with spouse On oxygen 3 L per minute nasal cannula 24/7  Review of Systems  All other systems reviewed and are negative.    Allergies  Review of patient's allergies indicates no known allergies.  Home Medications   Current Outpatient Rx  Name  Route  Sig  Dispense  Refill  . benazepril (LOTENSIN) 20 MG tablet   Oral   Take 20 mg by mouth 2 (two) times daily.         Marland Kitchen FLUoxetine (PROZAC) 20 MG tablet   Oral   Take 20 mg by mouth daily.         . furosemide (LASIX) 40 MG tablet  Oral   Take 40 mg by mouth 2 (two) times daily.         Marland Kitchen gabapentin (NEURONTIN) 600 MG tablet   Oral   Take 600 mg by mouth 4 (four) times daily.         . insulin detemir (LEVEMIR) 100 UNIT/ML injection   Subcutaneous   Inject 45 Units into the skin 2 (two) times daily.   10 mL   3   . isosorbide mononitrate (IMDUR) 60 MG 24 hr tablet   Oral   Take 60 mg by mouth daily.         . memantine (NAMENDA) 10 MG tablet   Oral   Take 10 mg by mouth 2 (two) times daily.         Marland Kitchen omeprazole (PRILOSEC) 40 MG capsule   Oral   Take 40 mg by mouth daily.         Marland Kitchen oxyCODONE-acetaminophen (PERCOCET) 10-325 MG per tablet   Oral   Take 1 tablet by mouth 2 (two) times daily.         . simvastatin (ZOCOR) 20 MG tablet   Oral   Take 20 mg by mouth at bedtime.         . Tamsulosin HCl (FLOMAX) 0.4 MG CAPS    Oral   Take by mouth daily. One capsule 30 mins after the same meal each day         . traMADol (ULTRAM) 50 MG tablet   Oral   Take 50 mg by mouth every 6 (six) hours as needed.         . dorzolamide-timolol (COSOPT) 22.3-6.8 MG/ML ophthalmic solution   Left Eye   Place 1 drop into the left eye 2 (two) times daily.         . insulin lispro (HUMALOG) 100 UNIT/ML injection   Subcutaneous   Inject 0-20 Units into the skin 3 (three) times daily as needed. Home sliding scale         . latanoprost (XALATAN) 0.005 % ophthalmic solution   Left Eye   Place 1 drop into the left eye daily.         Bertram Gala Glycol-Propyl Glycol (SYSTANE) 0.4-0.3 % SOLN   Left Eye   Place 1 drop into the left eye daily.          Marland Kitchen PRESCRIPTION MEDICATION   Ophthalmic   Apply to eye. 4 different eye drops          BP 94/68  Temp(Src) 98.2 F (36.8 C) (Oral)  Resp 12  SpO2 100%  Vital signs normal except hypertension  Physical Exam  Nursing note and vitals reviewed. Constitutional: He is oriented to person, place, and time. He appears well-developed and well-nourished.  Non-toxic appearance. He does not appear ill. No distress.  HENT:  Head: Normocephalic and atraumatic.  Right Ear: External ear normal.  Left Ear: External ear normal.  Nose: Nose normal. No mucosal edema or rhinorrhea.  Mouth/Throat: Oropharynx is clear and moist and mucous membranes are normal. No dental abscesses or edematous.  Eyes: Conjunctivae and EOM are normal. Pupils are equal, round, and reactive to light.  Neck: Normal range of motion and full passive range of motion without pain. Neck supple.  Cardiovascular: Normal rate, regular rhythm and normal heart sounds.  Exam reveals no gallop and no friction rub.   No murmur heard. Pulmonary/Chest: Effort normal and breath sounds normal. No respiratory distress. He has no wheezes. He  has no rhonchi. He has no rales. He exhibits no tenderness and no crepitus.   Abdominal: Soft. Normal appearance and bowel sounds are normal. He exhibits no distension. There is tenderness in the right lower quadrant and periumbilical area. There is no rigidity, no rebound and no guarding.  Genitourinary:  No external hemorrhoids seen, stool on glove is yellow without obvious blood  Musculoskeletal: Normal range of motion. He exhibits no edema and no tenderness.  Moves all extremities well.   Neurological: He is alert and oriented to person, place, and time. He has normal strength. No cranial nerve deficit.  Skin: Skin is warm, dry and intact. No rash noted. No erythema. There is pallor.  Psychiatric: He has a normal mood and affect. His speech is normal and behavior is normal. His mood appears not anxious.    ED Course  Procedures (including critical care time)  Medications  0.9 %  sodium chloride infusion ( Intravenous Stopped 07/05/13 1720)  iohexol (OMNIPAQUE) 300 MG/ML solution 25 mL (25 mLs Oral Contrast Given 07/05/13 1608)   Pt unable to get IV contrast for his CT scan b/o his renal insufficiency  Pt has not had diarrhea while in the ED. Wife wants him to be admitted She states he is having hip pain and that is why he can't walk, pt told me he was having neck pain and pain in his abdomen. More films ordered.   Pt able to stand at bedside.   Pt has some worsening of chronic anemia and CRI with some dehydration.   20:45 Dr Betti Cruz, admit to observation for Dr Siri Cole Review Results for orders placed during the hospital encounter of 07/05/13  CBC WITH DIFFERENTIAL      Result Value Range   WBC 9.9  4.0 - 10.5 K/uL   RBC 4.11 (*) 4.22 - 5.81 MIL/uL   Hemoglobin 9.0 (*) 13.0 - 17.0 g/dL   HCT 91.4 (*) 78.2 - 95.6 %   MCV 69.6 (*) 78.0 - 100.0 fL   MCH 21.9 (*) 26.0 - 34.0 pg   MCHC 31.5  30.0 - 36.0 g/dL   RDW 21.3 (*) 08.6 - 57.8 %   Platelets 267  150 - 400 K/uL   Neutrophils Relative % 63  43 - 77 %   Lymphocytes Relative 26  12 - 46 %    Monocytes Relative 10  3 - 12 %   Eosinophils Relative 1  0 - 5 %   Basophils Relative 0  0 - 1 %   Neutro Abs 6.2  1.7 - 7.7 K/uL   Lymphs Abs 2.6  0.7 - 4.0 K/uL   Monocytes Absolute 1.0  0.1 - 1.0 K/uL   Eosinophils Absolute 0.1  0.0 - 0.7 K/uL   Basophils Absolute 0.0  0.0 - 0.1 K/uL   RBC Morphology TARGET CELLS     WBC Morphology ATYPICAL LYMPHOCYTES     Smear Review LARGE PLATELETS PRESENT    COMPREHENSIVE METABOLIC PANEL      Result Value Range   Sodium 137  135 - 145 mEq/L   Potassium 5.0  3.5 - 5.1 mEq/L   Chloride 103  96 - 112 mEq/L   CO2 24  19 - 32 mEq/L   Glucose, Bld 263 (*) 70 - 99 mg/dL   BUN 65 (*) 6 - 23 mg/dL   Creatinine, Ser 4.69 (*) 0.50 - 1.35 mg/dL   Calcium 8.7  8.4 - 62.9 mg/dL   Total Protein  6.9  6.0 - 8.3 g/dL   Albumin 2.9 (*) 3.5 - 5.2 g/dL   AST 6  0 - 37 U/L   ALT 6  0 - 53 U/L   Alkaline Phosphatase 82  39 - 117 U/L   Total Bilirubin 0.2 (*) 0.3 - 1.2 mg/dL   GFR calc non Af Amer 25 (*) >90 mL/min   GFR calc Af Amer 29 (*) >90 mL/min  URINALYSIS, ROUTINE W REFLEX MICROSCOPIC      Result Value Range   Color, Urine YELLOW  YELLOW   APPearance CLEAR  CLEAR   Specific Gravity, Urine 1.018  1.005 - 1.030   pH 5.0  5.0 - 8.0   Glucose, UA 100 (*) NEGATIVE mg/dL   Hgb urine dipstick NEGATIVE  NEGATIVE   Bilirubin Urine NEGATIVE  NEGATIVE   Ketones, ur NEGATIVE  NEGATIVE mg/dL   Protein, ur NEGATIVE  NEGATIVE mg/dL   Urobilinogen, UA 1.0  0.0 - 1.0 mg/dL   Nitrite NEGATIVE  NEGATIVE   Leukocytes, UA NEGATIVE  NEGATIVE  APTT      Result Value Range   aPTT 26  24 - 37 seconds  PROTIME-INR      Result Value Range   Prothrombin Time 13.8  11.6 - 15.2 seconds   INR 1.08  0.00 - 1.49  TROPONIN I      Result Value Range   Troponin I <0.30  <0.30 ng/mL  OCCULT BLOOD, POC DEVICE      Result Value Range   Fecal Occult Bld NEGATIVE  NEGATIVE   Laboratory interpretation all normal except mild worsening of his chronic anemia, worsening of his  chronic renal insufficiency    Imaging Review Ct Abdomen Pelvis Wo Contrast  07/05/2013   CLINICAL DATA:  Right lower quadrant pain  EXAM: CT ABDOMEN AND PELVIS WITHOUT  TECHNIQUE: Multidetector CT imaging of the chest, abdomen and pelvis was performed following the standard protocol without IV contrast.  COMPARISON:  Prior CT abdomen/ pelvis 02/27/2013  FINDINGS: Lower Chest: Prominence of the interstitial markings bilaterally with mild interlobular septal thickening. Dependent atelectasis noted in the bilateral bases. The heart is within normal limits for size. Extensive atherosclerotic calcifications noted in the coronary arteries. No pericardial effusion. Unremarkable visualized distal thoracic esophagus.  Abdomen: Unenhanced CT was performed per clinician order. Lack of IV contrast limits sensitivity and specificity, especially for evaluation of abdominal/pelvic solid viscera. Unremarkable CT appearance of the stomach, spleen, adrenal glands, pancreas and liver. Surgical changes of prior cholecystectomy. No intra or extrahepatic biliary ductal dilatation. Unremarkable appearance of the bilateral kidneys. No focal solid lesion, hydronephrosis or nephrolithiasis. Subparagraph normal caliber large and small bowel throughout the abdomen. No evidence of obstruction or focal bowel wall thickening. Normal appendix in the right lower quadrant. The terminal ileum is unremarkable. No free fluid or suspicious adenopathy.  Pelvis: Unremarkable bladder, prostate gland and seminal vesicles. No free fluid or suspicious adenopathy.  Bones: No acute fracture or aggressive appearing lytic or blastic osseous lesion. Multilevel thoracolumbar degenerative disc disease and facet arthropathy.  Vascular: Limited evaluation in the absence of intravenous contrast. Atherosclerotic calcifications noted throughout the abdominal aorta. Fusiform aneurysmal dilatation of the infrarenal abdominal aorta with a maximal diameter of 3.4 cm.  Mild ectasia of the bilateral common iliac arteries greater on the left than the right. The left common iliac artery measures up to 1.6 cm in diameter while the right measures only 1.6 cm. 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  IMPRESSION: 1. No acute abnormality in the abdomen or pelvis to explain the patient's right lower quadrant pain. 2. Fusiform 3.4 cm infrarenal abdominal aortic aneurysm. Recommend follow up by [place] in 3years. This recommendation follows ACR consensus guidelines: White Paper of the ACR Incidental Findings Committee II on Vascular Findings. Earlyne Iba [id number]; 08:[id number]. 3. Multivessel coronary artery disease. 4. Chronic interstitial prominence versus early fibrotic change. 5. Irregularity of the left common femoral artery with a suggestion of an 8 mm pseudoaneurysm which appears grossly stable compared to 02/27/2013 although it is incompletely evaluated on today's study in the absence of intravenous contrast.   Electronically Signed   By: Malachy Moan M.D.   On: 07/05/2013 17:27   Dg Hip Complete Left  07/05/2013   *RADIOLOGY REPORT*  Clinical Data: Post fall, now with bilateral hip pain  LEFT HIP - COMPLETE 2+ VIEW  Comparison: CT abdomen pelvis - earlier same day  Findings: No fracture or dislocation.  Mild degenerate change of the left hip with joint space loss, subchondral sclerosis and osteophytosis.  Adjacent vascular calcifications. Surgical clips overlie the left inguinal region.  Regional soft tissues are otherwise normal.  No radiopaque foreign body.  IMPRESSION:  1.   No acute findings. 2.  Mild degenerative change of the left hip.   Original Report Authenticated By: Tacey Ruiz, MD   Dg Hip Complete Right  07/05/2013   *RADIOLOGY REPORT*  Clinical Data: Post fall, now with bilateral hip pain  RIGHT HIP - COMPLETE 2+ VIEW  Comparison: CT abdomen pelvis - 07/05/2013  Findings:  No definite fracture or dislocation.  Mild degenerative change right hip with joint space loss, subchondral sclerosis and minimal osteophytosis.  Vascular calcifications.  Regional soft tissues are normal.  Enteric contrast is seen within the terminal ileum.  IMPRESSION: 1.  No acute findings. 2.  Mild degenerative change of the right hip.   Original Report Authenticated By: Tacey Ruiz, MD   Ct Head Wo Contrast  Ct Cervical Spine Wo Contrast  07/05/2013   *RADIOLOGY REPORT*  Clinical Data:  Post fall, hit in the head, now with head and neck pain  CT HEAD WITHOUT CONTRAST CT CERVICAL SPINE WITHOUT CONTRAST  Technique:  Multidetector CT imaging of the head and cervical spine was performed following the standard protocol without intravenous contrast.  Multiplanar CT image reconstructions of the cervical spine were also generated.  Comparison:  Head CT - 10/25/2012; 05/20/2009  CT HEAD  Findings:  Redemonstrated marked volume loss with associated ex vacuo dilatation of the ventricular system and prominence of the bifrontal extra-axial spaces.  Dystrophic calcifications within the bilateral basal ganglia.  Scattered minimal periventricular hypodensity compatible with microvascular ischemic disease.  Given bac--------------------------------------------------kground parenchymal abnormalities, there is no CT evidence of acute large territory infarct.  No intraparenchymal or extra-axial mass or hemorrhage.  Unchanged in size and configuration of the ventricles and basilar cisterns.  No midline shift.  Limited visualization of the paranasal sinuses and mastoid air cells are normal.  Total soft tissues are normal.  No displaced calvarial fracture. No radiopaque foreign body.  Post bilateral cataract surgery.  IMPRESSION: Stable findings of atrophy and microvascular ischemic disease without acute intracranial process.  ---------------------------------------------------  CT CERVICAL SPINE  Findings:  C1 to the superior  endplate of T2 is imaged.  There is suboptimal evaluation of the caudal aspect of the cervical spine secondary to beam-hardening  artifact from the overlying shoulders.  There is straightening and slight reversal of the except that the cervical lordosis centered about the C5 - C6 articulation.  No definite anterolisthesis or retrolisthesis. There is a mild scoliotic curvature of the cervical spine, convex to the right, possibly positional.  The dens is normally positioned between the lateral masses of C1. Advanced degenerative change of the atlantodental articulation. There is a small osseous fragment adjacent to the caudal aspect of the anterior ring of the arch is C1 which is favored to be the sequela of remote injury. Normal atlantoaxial articulation.  The bilateral facets are normally aligned.  There is partial osseous fusion of the bilateral C2 - C3 articulations, left greater than right.  Cervical vertebral body heights are preserved.  Prevertebral soft tissues are normal.  Moderate to severe multilevel cervical spine DDD, likely worsened C5 - C6 with disc space height loss, end plate irregularity and posteriorly directed disc osteophyte complex at this level.  Regional soft tissues are normal.  IMPRESSION: 1.  No definite displaced fracture or static subluxation of the cervical spine. 2.  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 3.  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   Original Report Authenticated By: Tacey Ruiz, MD     Date: 07/05/2013  Rate: 90  Rhythm: normal sinus rhythm  QRS Axis: normal  Intervals: normal  ST/T Wave abnormalities: nonspecific T wave changes  Conduction Disutrbances:none  Narrative Interpretation: PVC  Old EKG Reviewed: unchanged from 10/25/2012    MDM   1. Diarrhea   2. Fall at home, initial  encounter   3. Abdominal pain   4. Anemia   5. Renal insufficiency   6. Dehydration     Plan admission  Devoria Albe, MD, Franz Dell, MD 07/05/13 2051

## 2013-07-05 NOTE — ED Notes (Signed)
Marquest Gunkel, daughter: [phone]

## 2013-07-05 NOTE — ED Notes (Signed)
Pt made aware about need for urine sample. Provided with urinal. Phlebotomy contacted x 2 about lab draw

## 2013-07-05 NOTE — ED Notes (Addendum)
Per EMS: Pt reports generalized abd pain and distention x 1 week with dark, tarry stools.  Hx GI bleed. Pt has generalized weakness with two falls. Reports falling this AM, no LOC. Pt reports lower back pain and neck pain, placed in C Collar. 109/53. 98 NSR c/ PACs. 94% RA. CBG 212

## 2013-07-05 NOTE — ED Notes (Signed)
Pt was able to stand at bedside but knees became weak and pt was sat back down onto bed. While pt was standing pts bed linen was changed. MD made aware

## 2013-07-05 NOTE — ED Notes (Signed)
Pt reports generalized weakness x "several days." Reports abdominal pain with diarrhea x 5 days. States yesterday he fell on the porch and this AM "I just fell right off the toilet." Pt denies LOC but states he hit his neck. Reports 8/10 right sided neck pain, pt in C Collar.

## 2013-07-05 NOTE — ED Notes (Signed)
Meal given

## 2013-07-05 NOTE — ED Notes (Signed)
Contacted phlebotomy about lab draw dt IV not pulling back and pt being hard stick.
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
## 2013-07-05 NOTE — ED Notes (Signed)
PT drinking oral contrast 

## 2013-07-05 NOTE — ED Notes (Signed)
MD Knapp at bedside 

## 2013-07-05 NOTE — ED Notes (Signed)
Pt finished oral contrast. CT made aware.

## 2013-07-05 NOTE — ED Notes (Signed)
Admitting MD at bedside.

## 2013-07-06 ENCOUNTER — Encounter (HOSPITAL_COMMUNITY): Payer: Self-pay | Admitting: *Deleted

## 2013-07-06 LAB — CBC
HCT: 28.1 % — ABNORMAL LOW (ref 39.0–52.0)
Hemoglobin: 8.9 g/dL — ABNORMAL LOW (ref 13.0–17.0)
MCH: 22.1 pg — ABNORMAL LOW (ref 26.0–34.0)
MCHC: 31.7 g/dL (ref 30.0–36.0)

## 2013-07-06 LAB — GLUCOSE, CAPILLARY
Glucose-Capillary: 288 mg/dL — ABNORMAL HIGH (ref 70–99)
Glucose-Capillary: 298 mg/dL — ABNORMAL HIGH (ref 70–99)
Glucose-Capillary: 223 mg/dL — ABNORMAL HIGH (ref 70–99)

## 2013-07-06 LAB — BASIC METABOLIC PANEL
BUN: 55 mg/dL — ABNORMAL HIGH (ref 6–23)
Chloride: 106 mEq/L (ref 96–112)
Creatinine, Ser: 1.63 mg/dL — ABNORMAL HIGH (ref 0.50–1.35)
GFR calc non Af Amer: 40 mL/min — ABNORMAL LOW (ref >90)
Glucose, Bld: 284 mg/dL — ABNORMAL HIGH (ref 70–99)
Potassium: 4.6 mEq/L (ref 3.5–5.1)
Sodium: 138 mEq/L (ref 135–145)

## 2013-07-06 MED ORDER — ONDANSETRON HCL 4 MG/2ML IJ SOLN: 4.0000 mg | Freq: Four times a day (QID) | INTRAMUSCULAR | Status: DC | PRN

## 2013-07-06 MED ORDER — PANTOPRAZOLE SODIUM 40 MG PO TBEC
40.0000 mg | DELAYED_RELEASE_TABLET | Freq: Every day | ORAL | Status: DC
  Administered 2013-07-06 – 2013-07-08 (×3): 40 mg via ORAL
  Filled 2013-07-06 (×3): qty 1

## 2013-07-06 MED ORDER — INSULIN DETEMIR 100 UNIT/ML ~~LOC~~ SOLN
45.0000 [IU] | Freq: Two times a day (BID) | SUBCUTANEOUS | Status: DC
  Administered 2013-07-06 – 2013-07-08 (×5): 45 [IU] via SUBCUTANEOUS
  Filled 2013-07-06 (×8): qty 0.45

## 2013-07-06 MED ORDER — DORZOLAMIDE HCL-TIMOLOL MAL 2-0.5 % OP SOLN
1.0000 [drp] | Freq: Two times a day (BID) | OPHTHALMIC | Status: DC
  Administered 2013-07-06 – 2013-07-08 (×5): 1 [drp] via OPHTHALMIC
  Filled 2013-07-06 (×2): qty 10

## 2013-07-06 MED ORDER — ISOSORBIDE MONONITRATE ER 60 MG PO TB24
60.0000 mg | ORAL_TABLET | Freq: Every day | ORAL | Status: DC
Start: 2013-07-06 — End: 2013-07-08
  Administered 2013-07-06 – 2013-07-08 (×3): 60 mg via ORAL
  Filled 2013-07-06 (×3): qty 1

## 2013-07-06 MED ORDER — INFLUENZA VAC SPLIT QUAD 0.5 ML IM SUSP: 0.5000 mL | INTRAMUSCULAR | Status: DC

## 2013-07-06 MED ORDER — ACETAMINOPHEN 325 MG PO TABS: 650.0000 mg | ORAL_TABLET | Freq: Four times a day (QID) | ORAL | Status: DC | PRN

## 2013-07-06 MED ORDER — SODIUM CHLORIDE 0.9 % IV SOLN
INTRAVENOUS | Status: DC
  Administered 2013-07-06: 06:00:00 via INTRAVENOUS

## 2013-07-06 MED ORDER — OXYCODONE-ACETAMINOPHEN 10-325 MG PO TABS: 1.0000 | ORAL_TABLET | Freq: Two times a day (BID) | ORAL | Status: DC

## 2013-07-06 MED ORDER — INSULIN ASPART 100 UNIT/ML ~~LOC~~ SOLN
0.0000 [IU] | Freq: Every day | SUBCUTANEOUS | Status: DC
Start: 2013-07-06 — End: 2013-07-08

## 2013-07-06 MED ORDER — BENAZEPRIL HCL 20 MG PO TABS
20.0000 mg | ORAL_TABLET | Freq: Two times a day (BID) | ORAL | Status: DC
  Administered 2013-07-06 – 2013-07-08 (×6): 20 mg via ORAL
  Filled 2013-07-06 (×7): qty 1

## 2013-07-06 MED ORDER — TRAMADOL HCL 50 MG PO TABS
50.0000 mg | ORAL_TABLET | Freq: Four times a day (QID) | ORAL | Status: DC | PRN
  Administered 2013-07-06 – 2013-07-07 (×2): 50 mg via ORAL
  Filled 2013-07-06 (×2): qty 1

## 2013-07-06 MED ORDER — LATANOPROST 0.005 % OP SOLN
1.0000 [drp] | Freq: Every day | OPHTHALMIC | Status: DC
  Administered 2013-07-06 – 2013-07-08 (×3): 1 [drp] via OPHTHALMIC
  Filled 2013-07-06 (×2): qty 2.5

## 2013-07-06 MED ORDER — MEMANTINE HCL 10 MG PO TABS
10.0000 mg | ORAL_TABLET | Freq: Two times a day (BID) | ORAL | Status: DC
  Administered 2013-07-06 – 2013-07-08 (×5): 10 mg via ORAL
  Filled 2013-07-06 (×6): qty 1

## 2013-07-06 MED ORDER — SIMVASTATIN 20 MG PO TABS
20.0000 mg | ORAL_TABLET | Freq: Every day | ORAL | Status: DC
  Administered 2013-07-06 – 2013-07-07 (×2): 20 mg via ORAL
  Filled 2013-07-06 (×3): qty 1

## 2013-07-06 MED ORDER — POLYETHYL GLYCOL-PROPYL GLYCOL 0.4-0.3 % OP SOLN: 1.0000 [drp] | Freq: Every day | OPHTHALMIC | Status: DC

## 2013-07-06 MED ORDER — INSULIN ASPART 100 UNIT/ML ~~LOC~~ SOLN
0.0000 [IU] | Freq: Three times a day (TID) | SUBCUTANEOUS | Status: DC
  Administered 2013-07-06: 17:00:00 5 [IU] via SUBCUTANEOUS
  Administered 2013-07-06 (×2): 8 [IU] via SUBCUTANEOUS
  Administered 2013-07-07 – 2013-07-08 (×2): 3 [IU] via SUBCUTANEOUS
  Administered 2013-07-08: 5 [IU] via SUBCUTANEOUS

## 2013-07-06 MED ORDER — GABAPENTIN 600 MG PO TABS
600.0000 mg | ORAL_TABLET | Freq: Four times a day (QID) | ORAL | Status: DC
  Administered 2013-07-06 – 2013-07-08 (×10): 600 mg via ORAL
  Filled 2013-07-06 (×12): qty 1

## 2013-07-06 MED ORDER — OXYCODONE HCL 5 MG PO TABS
5.0000 mg | ORAL_TABLET | Freq: Two times a day (BID) | ORAL | Status: DC
  Administered 2013-07-06 – 2013-07-08 (×6): 5 mg via ORAL
  Filled 2013-07-06 (×6): qty 1

## 2013-07-06 MED ORDER — SODIUM CHLORIDE 0.9 % IJ SOLN
3.0000 mL | Freq: Two times a day (BID) | INTRAMUSCULAR | Status: DC
  Administered 2013-07-06 – 2013-07-08 (×6): 3 mL via INTRAVENOUS

## 2013-07-06 MED ORDER — SODIUM CHLORIDE 0.9 % IV SOLN: INTRAVENOUS | Status: AC

## 2013-07-06 MED ORDER — INFLUENZA VAC SPLIT QUAD 0.5 ML IM SUSP
0.5000 mL | Freq: Once | INTRAMUSCULAR | Status: AC
Start: 2013-07-06 — End: 2013-07-06
  Administered 2013-07-06: 0.5 mL via INTRAMUSCULAR
  Filled 2013-07-06: qty 0.5

## 2013-07-06 MED ORDER — ACETAMINOPHEN 650 MG RE SUPP: 650.0000 mg | Freq: Four times a day (QID) | RECTAL | Status: DC | PRN

## 2013-07-06 MED ORDER — FLUOXETINE HCL 20 MG PO TABS
20.0000 mg | ORAL_TABLET | Freq: Every day | ORAL | Status: DC
  Administered 2013-07-06 – 2013-07-08 (×3): 20 mg via ORAL
  Filled 2013-07-06 (×3): qty 1

## 2013-07-06 MED ORDER — TAMSULOSIN HCL 0.4 MG PO CAPS
0.4000 mg | ORAL_CAPSULE | Freq: Every day | ORAL | Status: DC
  Administered 2013-07-06 – 2013-07-07 (×2): 0.4 mg via ORAL
  Filled 2013-07-06 (×3): qty 1

## 2013-07-06 MED ORDER — ONDANSETRON HCL 4 MG PO TABS: 4.0000 mg | ORAL_TABLET | Freq: Four times a day (QID) | ORAL | Status: DC | PRN

## 2013-07-06 MED ORDER — OXYCODONE-ACETAMINOPHEN 5-325 MG PO TABS
1.0000 | ORAL_TABLET | Freq: Two times a day (BID) | ORAL | Status: DC
  Administered 2013-07-06 – 2013-07-08 (×6): 1 via ORAL
  Filled 2013-07-06 (×6): qty 1

## 2013-07-06 MED ORDER — POLYVINYL ALCOHOL 1.4 % OP SOLN
1.0000 [drp] | Freq: Every day | OPHTHALMIC | Status: DC
  Administered 2013-07-06 – 2013-07-08 (×3): 1 [drp] via OPHTHALMIC
  Filled 2013-07-06 (×2): qty 15

## 2013-07-06 NOTE — Evaluation (Signed)
I have reviewed this note and agree with all findings. Kati Audriana Aldama, PT, DPT Pager: [phone]   

## 2013-07-06 NOTE — Evaluation (Signed)
Physical Therapy Evaluation Patient Details Name: [name] MRN: [id number] DOB: Jun 14, 1941 Today's Date: 07/06/2013 Time: 1140-1155 PT Time Calculation (min): 15 min  PT Assessment / Plan / Recommendation History of Present Illness  [name] is a 72 y.o. African American male with history of coronary disease, diabetes, congestive heart failure both systolic and diastolic EF of 40-45% based on a 2-D echocardiogram in January of 2014, stage IV chronic kidney disease, arthritis, COPD, obesity, obstructive sleep apnea, GERD, BPH, hypertension, and hyperlipidemia who presents with the above complaints.  Patient is a poor historian, but did provide most of the history.  He reported that his symptoms of diarrhea and vomiting started 3 days ago.  Clinical Impression  Pt admitted with diarrhea. Pt currently with functional limitations due to deficits listed below (see PT Problem List). Pt's primary limiting factor in participating in PT is reported dizziness that began yesterday. Pt states room is spinning, pt may benefit from vestibular exam to check for hypofunction or vertigo when pt able to tolerate tests. Pt states his family has to assist him in all ADLs and mobility but that they are limited in the assist that they can provide due to their impairments.  Pt will benefit from skilled PT to increase their independence and safety with mobility to allow discharge.    PT Assessment  Patient needs continued PT services    Follow Up Recommendations  SNF;Supervision/Assistance - 24 hour    Does the patient have the potential to tolerate intense rehabilitation      Barriers to Discharge Decreased caregiver support      Equipment Recommendations  None recommended by PT    Recommendations for Other Services     Frequency Min 3X/week    Precautions / Restrictions Precautions Precautions: Fall;Other (comment) Precaution Comments: Contact/Brown Restrictions Weight Bearing Restrictions: No    Pertinent Vitals/Pain No c/o of pain during session but pt reports dizziness at all times, that began yesterday. He reports "the room is spinning and I need to close my eyes to make it stop".  Pt positioned to comfort at end of session.      Mobility  Bed Mobility Bed Mobility: Rolling Left;Left Sidelying to Sit;Sitting - Scoot to Edge of Bed;Sit to Sidelying Left Rolling Left: 4: Min assist;With rail Left Sidelying to Sit: 4: Min assist;Other (comment) (1 HHA) Supine to Sit: 3: Mod assist;HOB elevated;With rails Sitting - Scoot to Edge of Bed: 4: Min guard;With rail Sit to Supine: 3: Mod assist;HOB flat Sit to Sidelying Left: 4: Min assist Scooting to HOB: 3: Mod assist;With rail Details for Bed Mobility Assistance: Min A during rolling left and L sidelying to sit with 1 HHA due to dizziness and weakness. VC's for hand placement. Pt reports dizziness at all times that began yesterday, he states "the room is spinning and I need to close my eyes." Dizziness did not worsen upon sitting, BP assessed seated EOB: 121/79. Transfers Transfers: Sit to Stand;Stand to Sit Sit to Stand: With upper extremity assist;1: +2 Total assist;From bed Sit to Stand: Patient Percentage: 40% Stand to Sit: To bed;1: +2 Total assist;With upper extremity assist Stand to Sit: Patient Percentage: 50% Details for Transfer Assistance: +2 assist for safety and due to c/o of pt falling at home while trying to stand with RW and family assist. VC's for proper hand placement and to stand upright. Pt able to march in place with RW, then requested to sit down due to dizziness and fatigue. Ambulation/Gait Ambulation/Gait  Assistance: Not tested (comment) Ambulation/Gait Assistance Details: pt too fatigued and dizzy to trial amb. Stairs: No    Exercises     PT Diagnosis: Difficulty walking;Generalized weakness  PT Problem List: Decreased strength;Decreased activity tolerance;Decreased mobility;Decreased knowledge of use of  DME;Other (comment) (dizziness) PT Treatment Interventions: DME instruction;Gait training;Functional mobility training;Therapeutic activities;Therapeutic exercise;Patient/family education     PT Goals(Current goals can be found in the care plan section) Acute Rehab PT Goals Patient Stated Goal: to get rid of dizziness and diarrhea PT Goal Formulation: With patient Time For Goal Achievement: 07/20/13 Potential to Achieve Goals: Good  Visit Information  Last PT Received On: 07/06/13 Assistance Needed: +2 History of Present Illness: [name] is a 72 y.o. African American male with history of coronary disease, diabetes, congestive heart failure both systolic and diastolic EF of 40-45% based on a 2-D echocardiogram in January of 2014, stage IV chronic kidney disease, arthritis, COPD, obesity, obstructive sleep apnea, GERD, BPH, hypertension, and hyperlipidemia who presents with the above complaints.  Patient is a poor historian, but did provide most of the history.  He reported that his symptoms of diarrhea and vomiting started 3 days ago.       Prior Functioning  Home Living Family/patient expects to be discharged to:: Private residence Living Arrangements: Spouse/significant other Available Help at Discharge: Family;Available PRN/intermittently Type of Home: Apartment Home Access: Level entry Home Layout: One level Home Equipment: Walker - 2 wheels;Tub bench Prior Function Level of Independence: Needs assistance Gait / Transfers Assistance Needed: Ambulates w/ RW  at home ADL's / Homemaking Assistance Needed: Assistance from family as per below, "They can't help me much any more" Comments: Lives with his wife who has had  stroke and can't use her left arm, nephew also stays with them but he has had some sort of foot amputation, son works during the day and doesn't live with them and daughter also has diabetes with neuropathy (limited assistance) Pt reports he has fallen at least 3  times in the last 6 months (twice in the two days prior to hospitalization), due to having difficulty when performing sit to stand. Communication Communication: No difficulties Dominant Hand: Left    Cognition  Cognition Arousal/Alertness: Awake/alert Behavior During Therapy: WFL for tasks assessed/performed Overall Cognitive Status: Within Functional Limits for tasks assessed    Extremity/Trunk Assessment Upper Extremity Assessment Upper Extremity Assessment: Generalized weakness Lower Extremity Assessment Lower Extremity Assessment: Generalized weakness   Balance    End of Session PT - End of Session Activity Tolerance: Patient limited by fatigue;Other (comment) (dizziness) Patient left: in bed;with call bell/phone within reach  GP     Sol Blazing 07/06/2013, 12:36 PM
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
## 2013-07-06 NOTE — Progress Notes (Signed)
Inpatient Diabetes Program Recommendations  AACE/ADA: New Consensus Statement on Inpatient Glycemic Control (2013)  Target Ranges:  Prepandial:   less than 140 mg/dL      Peak postprandial:   less than 180 mg/dL (1-2 hours)      Critically ill patients:  140 - 180 mg/dL    Results for [name], [name] (MRN [id number]) as of 07/06/2013 12:25  Ref. Range 07/06/2013 08:05 07/06/2013 12:13  Glucose-Capillary Latest Range: 70-99 mg/dL 578 (H) 469 (H)   Inpatient Diabetes Program Recommendations Correction (SSI): Please consider increasing Novolog correction to resistant scale which is more comparable to outpatient insulin correction scale. Basal:  Please consider increasing Levemir to 47 units BID.  Note: Patient has a history of diabetes and according to the medication list patient takes Levemir 45 units BID and Humalog 0-20 units TID as an outpatient for diabetes management.  Currently, patient is ordered to receive Levemir 45 units BID, Novolog 0-15 units AC, and Novolog 0-5 units HS for inpatient glycemic control.  Please consider increasing Novolog correction to resistant scale which is more comparable to outpatient insulin correction scale.  Also, please consider increasing  Levemir to 47 units BID. Will continue to follow.  Thanks, Orlando Penner, RN, MSN, CCRN Diabetes Coordinator Inpatient Diabetes Program [phone] (Team Pager) [phone] ([facility] office) [phone] [facility] office)

## 2013-07-06 NOTE — Progress Notes (Deleted)
Admitting Diagnosis:   /Pertinent History:    Living Situation (Home, SNF, ALF, alone, friends, family): home with spouse  Mobility (Walker, WC, Independent, +1/+2 assist, etc.)   Consult Needs or Recommendations (PT, OT, ST, SW, CM, Nutrition) :   Diet : carb  mod  IV Medications/Drips/ABX :   Hemodialysis Days : none  Barriers to Progression-What's keeping them here?

## 2013-07-06 NOTE — Evaluation (Signed)
Occupational Therapy Evaluation Patient Details Name: [name] MRN: [id number] DOB: 17-Sep-1941 Today's Date: 07/06/2013 Time: 0935-1006 OT Time Calculation (min): 31 min  OT Assessment / Plan / Recommendation History of present illness [name] is a 72 y.o. African American male with history of coronary disease, diabetes, congestive heart failure both systolic and diastolic EF of 40-45% based on a 2-D echocardiogram in January of 2014, stage IV chronic kidney disease, arthritis, COPD, obesity, obstructive sleep apnea, GERD, BPH, hypertension, and hyperlipidemia who presents with the above complaints.  Patient is a poor historian, but did provide most of the history.  He reported that his symptoms of diarrhea and vomiting started 3 days ago.   Clinical Impression   Pt presents w/ dx as per above affecting his ability to perform ADL and self care tasks. Pt will benefit from acute OT to address problems as outlined below and increase independence in preparation for d/c to next venue.    OT Assessment  Patient needs continued OT Services    Follow Up Recommendations  SNF    Barriers to Discharge Decreased caregiver support;Other (comment) Pt wife is primary caregiver & has had CVA affecting L side, decreased ability to assist pt PRN.  Equipment Recommendations  None recommended by OT    Recommendations for Other Services    Frequency  Min 2X/week    Precautions / Restrictions Precautions Precautions: Other (comment) Precaution Comments: Contact/Brown Restrictions Weight Bearing Restrictions: No   Pertinent Vitals/Pain No c/o    ADL  Eating/Feeding: Performed;Modified independent Where Assessed - Eating/Feeding: Bed level Grooming: Performed;Wash/dry hands;Minimal assistance Where Assessed - Grooming: Supported standing Upper Body Bathing: Simulated;Minimal assistance Where Assessed - Upper Body Bathing: Supported sitting Lower Body Bathing: Simulated;Maximal  assistance Where Assessed - Lower Body Bathing: Supported sit to stand Upper Body Dressing: Performed;Minimal assistance Where Assessed - Upper Body Dressing: Unsupported sitting Lower Body Dressing: Performed;Maximal assistance Where Assessed - Lower Body Dressing: Supported sit to Pharmacist, hospital: Performed;Moderate assistance Toilet Transfer Method: Sit to stand Toilet Transfer Equipment: Bedside commode Toileting - Clothing Manipulation and Hygiene: Performed;Minimal assistance Where Assessed - Engineer, mining and Hygiene: Standing;Sit to stand from 3-in-1 or toilet Tub/Shower Transfer Method: Not assessed Equipment Used: Gait belt;Rolling walker Transfers/Ambulation Related to ADLs: Pt w/ Mod assist sit to stand trasnfers, he is legally blind and noted to ambulate w/ RW & knees extended/shuffling feet on floor. ?If this is related to blindness. Pt overall deconditioned. ADL Comments: Pt was educated in Role of OT & then participated in ADL tasks: toilet transfer to 3:1 ambulating w/ RW, LB dressing and grooming/washing handds standing at sink. Pt overall Mod A-Max A functional mobility/transfers and LB dressing.     OT Diagnosis: Generalized weakness  OT Problem List: Decreased strength;Decreased activity tolerance;Decreased knowledge of use of DME or AE OT Treatment Interventions: Self-care/ADL training;Therapeutic exercise;DME and/or AE instruction;Patient/family education;Therapeutic activities;Balance training   OT Goals(Current goals can be found in the care plan section) Acute Rehab OT Goals Patient Stated Goal: "Get better" Time For Goal Achievement: 07/20/13 Potential to Achieve Goals: Fair  Visit Information  Last OT Received On: 07/06/13 Assistance Needed: +1 History of Present Illness: [name] is a 72 y.o. African American male with history of coronary disease, diabetes, congestive heart failure both systolic and diastolic EF of 40-45% based on  a 2-D echocardiogram in January of 2014, stage IV chronic kidney disease, arthritis, COPD, obesity, obstructive sleep apnea, GERD, BPH, hypertension, and hyperlipidemia who presents  with the above complaints.  Patient is a poor historian, but did provide most of the history.  He reported that his symptoms of diarrhea and vomiting started 3 days ago.       Prior Functioning     Home Living Family/patient expects to be discharged to:: Private residence Living Arrangements: Spouse/significant other Type of Home: Apartment Home Access: Level entry Home Layout: One level Home Equipment: Environmental consultant - 2 wheels;Tub bench Prior Function Level of Independence: Needs assistance Gait / Transfers Assistance Needed: Ambulates w/ RW  at home ADL's / Homemaking Assistance Needed: Assistance from family as per below, "They can't help me much any more" Comments: Lives with his wife who has had  stroke and can't use her left arm, nephew also stays with them but he has had some sort of foot amputation, son works during the day and doesn't live with them and daughter also has diabetes with neuropathy (limited assistance) Communication Communication: No difficulties Dominant Hand: Left    Vision/Perception Vision - History Baseline Vision: Legally blind Patient Visual Report: No change from baseline   Cognition  Cognition Arousal/Alertness: Awake/alert Behavior During Therapy: WFL for tasks assessed/performed Overall Cognitive Status: Within Functional Limits for tasks assessed    Extremity/Trunk Assessment Upper Extremity Assessment Upper Extremity Assessment: Generalized weakness Lower Extremity Assessment Lower Extremity Assessment: Defer to PT evaluation    Mobility Bed Mobility Bed Mobility: Supine to Sit;Sitting - Scoot to [place] of Bed;Scooting to [facility];Sit to Supine Supine to Sit: 3: Mod assist;HOB elevated;With rails Sitting - Scoot to Edge of Bed: With rail;4: Min assist Sit to Supine: 3: Mod  assist;HOB flat Scooting to HOB: 3: Mod assist;With rail Details for Bed Mobility Assistance: VC's for safety and hand placement Transfers Transfers: Sit to Stand;Stand to Sit Sit to Stand: 3: Mod assist;With upper extremity assist;From bed;From chair/3-in-1 Stand to Sit: 4: Min assist;To bed;To chair/3-in-1;With upper extremity assist Details for Transfer Assistance: VC's for safety w/ RW & hand placement for controlled decent       Balance     End of Session OT - End of Session Equipment Utilized During Treatment: Gait belt;Rolling walker Activity Tolerance: Patient limited by fatigue;Patient limited by lethargy Patient left: in bed;with call bell/phone within reach;with bed alarm set Nurse Communication: Mobility status  GO     Jawon, Dipiero Dixon 07/06/2013, 11:43 AM

## 2013-07-06 NOTE — Progress Notes (Addendum)
admit  To  Room  5w 22 Orientation  To  Room  Call bell  Safety plan Mental  Status alert and oriented  X 3 Living arrangements  Home with spouse  Skin   intact Family  In attendance   Wife left to go home  From  emer dept Assessment  See  Doc flow  sheet

## 2013-07-07 LAB — BASIC METABOLIC PANEL
CO2: 26 mEq/L (ref 19–32)
Calcium: 8.6 mg/dL (ref 8.4–10.5)
Creatinine, Ser: 1.69 mg/dL — ABNORMAL HIGH (ref 0.50–1.35)
GFR calc non Af Amer: 38 mL/min — ABNORMAL LOW (ref >90)
Glucose, Bld: 73 mg/dL (ref 70–99)
Potassium: 4.7 mEq/L (ref 3.5–5.1)
Sodium: 141 mEq/L (ref 135–145)

## 2013-07-07 LAB — GLUCOSE, CAPILLARY
Glucose-Capillary: 70 mg/dL (ref 70–99)
Glucose-Capillary: 172 mg/dL — ABNORMAL HIGH (ref 70–99)

## 2013-07-07 MED ORDER — FUROSEMIDE 20 MG PO TABS
20.0000 mg | ORAL_TABLET | Freq: Every day | ORAL | Status: DC
  Administered 2013-07-07 – 2013-07-08 (×2): 20 mg via ORAL
  Filled 2013-07-07 (×4): qty 1

## 2013-07-07 MED ORDER — POLYSACCHARIDE IRON COMPLEX 150 MG PO CAPS
150.0000 mg | ORAL_CAPSULE | Freq: Every day | ORAL | Status: DC
  Administered 2013-07-07 – 2013-07-08 (×2): 150 mg via ORAL
  Filled 2013-07-07 (×2): qty 1

## 2013-07-07 MED ORDER — FUROSEMIDE 20 MG PO TABS: 20.0000 mg | ORAL_TABLET | Freq: Every day | ORAL | Status: AC

## 2013-07-07 MED ORDER — POLYSACCHARIDE IRON COMPLEX 150 MG PO CAPS: 150.0000 mg | ORAL_CAPSULE | Freq: Every day | ORAL | Status: AC

## 2013-07-07 MED ORDER — POLYETHYLENE GLYCOL 3350 17 G PO PACK
17.0000 g | PACK | Freq: Every day | ORAL | Status: DC
  Administered 2013-07-07: 17 g via ORAL
  Filled 2013-07-07 (×2): qty 1
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
## 2013-07-07 NOTE — Progress Notes (Signed)
Utilization review completed. Sergi Gellner, RN, BSN. 

## 2013-07-07 NOTE — Clinical Social Work Psychosocial (Signed)
Clinical Social Work Department BRIEF PSYCHOSOCIAL ASSESSMENT 07/07/2013  Patient:  FREMONT, SKALICKY     Account Number:  [id number]     Admit date:  07/05/2013  Clinical Social Worker:  Delmer Islam  Date/Time:  07/07/2013 02:57 PM  Referred by:  Physician  Date Referred:  07/07/2013 Referred for  SNF Placement   Other Referral:   Interview type:  Patient Other interview type:   Patient wife, Takumi Din [phone]    PSYCHOSOCIAL DATA Living Status:  WIFE Admitted from facility:   Level of care:   Primary support name:  Adaline Sill Primary support relationship to patient:  SPOUSE Degree of support available:   Patient's wife is dedicated to her husband.    CURRENT CONCERNS Current Concerns  Post-Acute Placement   Other Concerns:    SOCIAL WORK ASSESSMENT / PLAN CSW intern introduced self and supervisor to patient and patient's wife, Daisy Mcneel. Mr. Dorsch seemed content and open to talk with CSW intern. CSW intern advised  patient wife, that patient has been evaluated by PT/OT and short term rehab is being recommended. Mrs. Skowron requested [facility] as her choice of placement and If no bed available at [facility], her plan is take patient home. Mrs. Batzel was given a list of nursing skilled facilities and CSW's contact information for any additional assistance. CSW and intern will follow up with facility responses.   Assessment/plan status:  Psychosocial Support/Ongoing Assessment of Needs Other assessment/ plan:   Information/referral to community resources: Skilled facility list for [facility].   PATIENT'S/FAMILY'S RESPONSE TO PLAN OF CARE: Patient and wife were very pleasant and was looking forward to speaking with social worker. Mr. Schmale was quiet but listening during the interaction. Mrs. Schadt has CSW contact information for any additional information needed.      Deniece Ree, CSW Intern  Reviewed and Approved by Genelle Bal, LCSW

## 2013-07-07 NOTE — Progress Notes (Addendum)
Blood glucose 66 at 8:00.  120 oz apple juice given.  Pt ate eggs and bacon for breakfast.  BG rechecked and BG after breakfast and BG 70.  Will continue to monitor.   Diabetes Coordinator notified RN that 45 units of Levemir had been given to patient 3 times yesterday (ordered BID).  Patient notified.  MD notified.  MD stated to closely monitor patient CBGs and hold 10am dose of Levemir.

## 2013-07-07 NOTE — Progress Notes (Signed)
Inpatient Diabetes Program Recommendations  AACE/ADA: New Consensus Statement on Inpatient Glycemic Control (2013)  Target Ranges:  Prepandial:   less than 140 mg/dL      Peak postprandial:   less than 180 mg/dL (1-2 hours)      Critically ill patients:  140 - 180 mg/dL   Results for [name], [name] (MRN [id number]) as of 07/07/2013 10:11  Ref. Range 07/06/2013 08:05 07/06/2013 12:13 07/06/2013 16:44 07/06/2013 21:15 07/07/2013 07:41 07/07/2013 09:35  Glucose-Capillary Latest Range: 70-99 mg/dL 413 (H) 244 (H) 010 (H) 176 (H) 66 (L) 70    Inpatient Diabetes Program Recommendations Correction (SSI): Please consider increasing Novolog correction to resistant scale which is more comparable to outpatient insulin correction scale. Basal:  Please consider increasing Levemir to 50 units BID (for a total of 100 units/day).  Please note that patient received a total of Levemir 135 units yesterday due to extra dose of Levemir being given.   Note: PLEASE NOTE that patient actually received a total of Levemir 135 units yesterday (Levemir 45 units at 6:05am, Levemir 45 units at 17:20, and Levemir 45 units at 22:47) which resulted in a blood glucose of 66 mg/dl this morning. The actual order for Levemir is 45 units BID.  Spoke with Morrie Sheldon, RN on 5W and informed her that patient received 3 doses of Levemir 45 units yesterday and to be sure that the doctor did not decrease the ordered basal amount since the patient received an extra dose of Levemir yesterday.  Please consider increasing Levemir to 50 units BID (starting with evening dose) and increasing Novolog correction to resistant scale.  Will continue to follow.  Thanks, Orlando Penner, RN, MSN, CCRN Diabetes Coordinator Inpatient Diabetes Program [phone] (Team Pager) [phone] (AP office) [phone] [facility] office)

## 2013-07-08 ENCOUNTER — Encounter: Payer: Self-pay | Admitting: Internal Medicine

## 2013-07-08 ENCOUNTER — Non-Acute Institutional Stay (SKILLED_NURSING_FACILITY): Payer: Medicare Other | Admitting: Internal Medicine

## 2013-07-08 DIAGNOSIS — G4733 Obstructive sleep apnea (adult) (pediatric): Secondary | ICD-10-CM

## 2013-07-08 DIAGNOSIS — F0390 Unspecified dementia without behavioral disturbance: Secondary | ICD-10-CM

## 2013-07-08 DIAGNOSIS — R5381 Other malaise: Secondary | ICD-10-CM

## 2013-07-08 DIAGNOSIS — E119 Type 2 diabetes mellitus without complications: Secondary | ICD-10-CM

## 2013-07-08 DIAGNOSIS — K219 Gastro-esophageal reflux disease without esophagitis: Secondary | ICD-10-CM

## 2013-07-08 DIAGNOSIS — G589 Mononeuropathy, unspecified: Secondary | ICD-10-CM

## 2013-07-08 DIAGNOSIS — G629 Polyneuropathy, unspecified: Secondary | ICD-10-CM

## 2013-07-08 DIAGNOSIS — N179 Acute kidney failure, unspecified: Secondary | ICD-10-CM

## 2013-07-08 DIAGNOSIS — N184 Chronic kidney disease, stage 4 (severe): Secondary | ICD-10-CM

## 2013-07-08 DIAGNOSIS — I509 Heart failure, unspecified: Secondary | ICD-10-CM

## 2013-07-08 DIAGNOSIS — I5042 Chronic combined systolic (congestive) and diastolic (congestive) heart failure: Secondary | ICD-10-CM | POA: Insufficient documentation

## 2013-07-08 DIAGNOSIS — E785 Hyperlipidemia, unspecified: Secondary | ICD-10-CM

## 2013-07-08 LAB — GLUCOSE, CAPILLARY
Glucose-Capillary: 244 mg/dL — ABNORMAL HIGH (ref 70–99)
Glucose-Capillary: 162 mg/dL — ABNORMAL HIGH (ref 70–99)

## 2013-07-08 MED ORDER — TRAMADOL HCL 50 MG PO TABS: 50.0000 mg | ORAL_TABLET | Freq: Four times a day (QID) | ORAL | Status: AC | PRN

## 2013-07-08 MED ORDER — OXYCODONE-ACETAMINOPHEN 10-325 MG PO TABS: 1.0000 | ORAL_TABLET | Freq: Two times a day (BID) | ORAL | Status: DC

## 2013-07-08 NOTE — Assessment & Plan Note (Signed)
Continue omeprazole 

## 2013-07-08 NOTE — Assessment & Plan Note (Signed)
Continue neurontin 

## 2013-07-08 NOTE — Assessment & Plan Note (Signed)
OT/PT 

## 2013-07-08 NOTE — Progress Notes (Signed)
Patient was discharged to [facility] by ambulance. Patient's belongings were placed in patient belonging bag. Staff member, Tammy, from [employer] asked for clarification order for CPAP. MD on call changed order per wife's statement. Patient was stable upon discharge.

## 2013-07-08 NOTE — Assessment & Plan Note (Addendum)
Last HbA1c was 2 years ago, 8.1;levimer, SSI and diabetic diet

## 2013-07-08 NOTE — Assessment & Plan Note (Signed)
Continue namenda 

## 2013-07-08 NOTE — Assessment & Plan Note (Signed)
Due to diarrhea-baseline Cr appears to be around 2.0

## 2013-07-08 NOTE — Clinical Social Work Placement (Signed)
Clinical Social Work Department CLINICAL SOCIAL WORK PLACEMENT NOTE 07/08/2013  Patient:  [name], [name]  Account Number:  [id number] Admit date:  07/05/2013  Clinical Social Worker:  Genelle Bal, LCSW  Date/time:  07/08/2013 06:35 AM  Clinical Social Work is seeking post-discharge placement for this patient at the following level of care:   SKILLED NURSING   (*CSW will update this form in Epic as items are completed)   07/07/2013  Patient/family provided with [facility] Department of Clinical Social Work's list of facilities offering this level of care within the geographic area requested by the patient (or if unable, by the patient's family).  07/07/2013  Patient/family informed of their freedom to choose among providers that offer the needed level of care, that participate in Medicare, Medicaid or managed care program needed by the patient, have an available bed and are willing to accept the patient.    Patient/family informed of [facility]' ownership interest in [facility], as well as of the fact that they are under no obligation to receive care at this facility.  PASARR submitted to EDS on  PASARR number received from EDS on 2009 - [id number] [id number]  FL2 transmitted to all facilities in geographic area requested by pt/family on  07/07/2013 FL2 transmitted to all facilities within larger geographic area on   Patient informed that his/her managed care company has contracts with or will negotiate with  certain facilities, including the following:     Patient/family informed of bed offers received:  07/08/2013 Patient chooses bed at [facility], [place] Physician recommends and patient chooses bed at    Patient to be transferred to [facility], [facility] on  07/08/2013 Patient to be transferred to facility by ambulance  The following physician request were entered in Epic:   Additional Comments: 07/08/13 - CSW contacted patient's wife to get  SNF decision and confirmed today's discharge.

## 2013-07-08 NOTE — Assessment & Plan Note (Signed)
EF 40-45% per Echo 09/2012; compensated;continue ace, lasix,imdur

## 2013-07-08 NOTE — Assessment & Plan Note (Signed)
No mention of cpap; needs c pap

## 2013-07-08 NOTE — Assessment & Plan Note (Signed)
Diet, exercise, in this case PT/OT

## 2013-07-08 NOTE — Discharge Summary (Addendum)
Physician Discharge Summary  Patient ID: [name] MRN: [id number] DOB/AGE: 74-Aug-1942 74 y.o.  Admit date: 07/05/2013 Discharge date: 07/08/2013  Admission Diagnoses:  Discharge Diagnoses:  Principal Problem:   Diarrhea-Acute gastroenteritis Acute on chronic renal Failure Active Problems:   Falls   Obesity, morbid (more than 100 lbs over ideal weight or BMI > 40)   CKD (chronic kidney disease), stage IV   Physical deconditioning   Obstructive sleep apnea   Diabetes mellitus   Hypertension   Dyslipidemia   Benign prostatic hypertrophy   Gastroesophageal reflux disease   Near syncope   Chronic back pain   Acute on chronic kidney disease, stage 3/ 4   Vomiting   Generalized weakness Depression History of CAD/CHF diabetic retnopahy/neuropahy/Nephrathy Severe profound neuropathy BPH   Discharged Condition: good  Hospital Course: 72 year old  Male, admitted After episode of nausea vomiting diarrhea, Weakness, follow; possible  Presyncope Problem #1: nausea vomiting diarrhea with some abdominal pain: CT scan of the abdomen pelvic negative for any acute findings; blood counts normal. Admitted for possible gastroenteritis with dehydration; IV fluids; Lasix was held. Diarrhea improved; no vomiting; chronic lower abdominal pain from neuropathy. No evidence of infection.  problem #2: Acute on chronic renal failure: BUN/creatinine elevated; because of dehydration and Lasix; IV fluids was  Given;  Lasix held- Creatinine improved baseline 1.6 at the time of the discharge ( baseline- 1.6- 2.2) Problem #3: chronic kidney disease stage 3-4 Problem #4: weakness/deconditoning/combination of chronic  Pain; severe neuropathy; spinal stenosis Morbid obesity; gait abnormality. CT scan f the head negative CT of the neck and the spine ive for acute fracture; x-rays of the hip negative. Physical therapy occupational therapy- recommended inpatient/ SNF- rehabilitation. Problem #5: chronic pain:  multifactorial; continue on Neurontin and Percocet; tramadol p.r.n. Problem #6: history of CHF CAD restart Lasix at 20 mg Titrate to 40 mg- as Tolerated.Continue on Benazepril Problem #7: diabetes moderately controlled, continue on insulin. Problem #8: history of constipation; continue Mira Lax Problem #9: sleep apnea no longer on CPAP Problem #10: anemia, anemia of chronic disease; start on iron  Tablet. Problem #11: depression  Continue on  Fluoxetine    Consults: None  Significant Diagnostic Studies: labs: Blood chemistries- creatinine 1.6 at discharge; hemoglobin between 9-10; WBC count normal; urine negative. Troponin negative. and radiology: X-Ray: hip negative and CT scan: head, CT of the spine, CT of  the abdomen  negaativ  Treatments: IV hydration  Discharge Exam: Blood pressure 132/68, pulse 74, temperature 98.7 F (37.1 C), temperature source Oral, resp. rate 18, height 6\' 1"  (1.854 m), weight 140.8 kg (310 lb 6.5 oz), SpO2 98.00%. General appearance: alert Resp: clear to auscultation bilaterally Cardio: regular rate and rhythm GI: soft, non-tender; bowel sounds normal; no masses,  no organomegaly Extremities: venous stasis dermatitis noted and trace edema  Disposition: Skiled rehabilitation  Discharge Orders   Future Appointments Provider Department Dept Phone   07/22/2013 3:00 PM Alvan Dame, DPM [facility] [phone]   Future Orders Complete By Expires   Diet - low sodium heart healthy  As directed    Increase activity slowly  As directed        Medication List         benazepril 20 MG tablet  Commonly known as:  LOTENSIN  Take 20 mg by mouth 2 (two) times daily.     dorzolamide-timolol 22.3-6.8 MG/ML ophthalmic solution  Commonly known as:  COSOPT  Place 1 drop into the left eye  2 (two) times daily.     FLUoxetine 20 MG tablet  Commonly known as:  PROZAC  Take 20 mg by mouth daily.     furosemide 20 MG tablet  Commonly known  as:  LASIX  Take 1 tablet (20 mg total) by mouth daily.     gabapentin 600 MG tablet  Commonly known as:  NEURONTIN  Take 600 mg by mouth 4 (four) times daily.     insulin detemir 100 UNIT/ML injection  Commonly known as:  LEVEMIR  Inject 45 Units into the skin 2 (two) times daily.     insulin lispro 100 UNIT/ML injection  Commonly known as:  HUMALOG  Inject 0-20 Units into the skin 3 (three) times daily as needed. Home sliding scale     iron polysaccharides 150 MG capsule  Commonly known as:  NIFEREX  Take 1 capsule (150 mg total) by mouth daily.     isosorbide mononitrate 60 MG 24 hr tablet  Commonly known as:  IMDUR  Take 60 mg by mouth daily.     latanoprost 0.005 % ophthalmic solution  Commonly known as:  XALATAN  Place 1 drop into the left eye daily.     memantine 10 MG tablet  Commonly known as:  NAMENDA  Take 10 mg by mouth 2 (two) times daily.     omeprazole 40 MG capsule  Commonly known as:  PRILOSEC  Take 40 mg by mouth daily.     oxyCODONE-acetaminophen 10-325 MG per tablet  Commonly known as:  PERCOCET  Take 1 tablet by mouth 2 (two) times daily.     simvastatin 20 MG tablet  Commonly known as:  ZOCOR  Take 20 mg by mouth at bedtime.     SYSTANE 0.4-0.3 % Soln  Generic drug:  Polyethyl Glycol-Propyl Glycol  Place 1 drop into the left eye daily.     tamsulosin 0.4 MG Caps capsule  Commonly known as:  FLOMAX  Take 0.4 mg by mouth daily.     traMADol 50 MG tablet  Commonly known as:  ULTRAM  Take 1 tablet (50 mg total) by mouth every 6 (six) hours as needed.        Discharge planning time total 45 minutes. SignedGeorgann Housekeeper 07/08/2013, 7:54 AM

## 2013-07-08 NOTE — Assessment & Plan Note (Signed)
Continue zocor 

## 2013-07-08 NOTE — Progress Notes (Signed)
MRN: [id number] Name: [name]  Sex: male Age: 72 y.o. DOB: 07/12/1941  PSC #: [facility] Facility/Room:106A Level Of Care: SNF Provider: Merrilee Seashore D Emergency Contacts: Extended Emergency Contact Information Primary Emergency Contact: Dollar,Betty Address: [address]          [place], [place] [address] [place] Home Phone: [phone] Relation: Spouse  Code Status: Full  Allergies: Review of patient's allergies indicates no known allergies.  Chief Complaint  Patient presents with  . nursing home admission    HPI: Patient is 72 y.o. male who who fell at home from generalized deconditioning secondary to multple medical problems.  Past Medical History  Diagnosis Date  . Coronary artery disease   . Diabetes mellitus   . CHF (congestive heart failure)   . Stage III chronic kidney disease   . Arthritis   . COPD (chronic obstructive pulmonary disease)   . Chronic respiratory failure   . Obesity, Class III, BMI 40-49.9 (morbid obesity)   . Obstructive sleep apnea   . Gastroesophageal reflux   . Benign prostatic hypertrophy   . Hyperlipidemia   . Hypertension   . Microcytic anemia 12/16/2011  . CHF (congestive heart failure)     Past Surgical History  Procedure Laterality Date  . Back surgery    . Coronary artery bypass graft    . Cholecystectomy    . Tonsillectomy    . Cataract extraction        Medication List    Notice   This visit is during an admission. Changes to the med list made in this visit will be reflected in the After Visit Summary of the admission.    Meds should have come over in this template. All meds were reviewed by me.  No orders of the defined types were placed in this encounter.    Immunization History  Administered Date(s) Administered  . Influenza,inj,Quad PF,36+ Mos 07/06/2013    History  Substance Use Topics  . Smoking status: Former Smoker -- 1.00 packs/day for 46 years    Types: Cigarettes    Quit  date: 09/02/2001  . Smokeless tobacco: Not on file  . Alcohol Use: No    Family history is noncontributory    Review of Systems  DATA OBTAINED: from patient PT HAS NO C/O FOR ANYRHING GENERAL: Feels well no fevers, fatigue, appetite changes SKIN: No itching, rash or wounds EYES: No eye pain, redness, discharge EARS: No earache, tinnitus, change in hearing NOSE: No congestion, drainage or bleeding  MOUTH/THROAT: No mouth or tooth pain, No sore throat, No difficulty chewing or swallowing  RESPIRATORY: No cough, wheezing, SOB CARDIAC: No chest pain, palpitations, lower extremity edema  GI: No abdominal pain, No N/V/D or constipation, No heartburn or reflux  GU: No dysuria, frequency or urgency, or incontinence  MUSCULOSKELETAL: No unrelieved bone/joint pain NEUROLOGIC: No headache, dizziness or focal weakness PSYCHIATRIC: No overt anxiety or sadness. Sleeps well. No behavior issue.   Filed Vitals:   07/08/13 2036  BP: 123/69  Pulse: 63  Temp: 98.9 F (37.2 C)  Resp: 18    Physical Exam  GENERAL APPEARANCE: Alert, conversant but slow speech Appropriately groomed. No acute distress; obese SKIN: No diaphoresis rash, or wounds HEAD: Normocephalic, atraumatic  EYES: Conjunctiva/lids clear. Pupils round, reactive. EOMs intact.  EARS: External exam WNL, canals clear. Hearing grossly normal.  NOSE: No deformity or discharge.  MOUTH/THROAT: Lips w/o lesions.  RESPIRATORY: Breathing is even, unlabored. Lung sounds are clear  CARDIOVASCULAR: Heart RRR no murmurs, rubs or gallops. No peripheral edema.  ARTERIAL: radial pulse 2+, DP pulse 1+  VENOUS: No varicosities. No venous stasis skin changes  GASTROINTESTINAL: Abdomen is soft, non-tender, not distended w/ normal bowel sounds GENITOURINARY: Bladder non tender, not distended  MUSCULOSKELETAL: No abnormal joints or musculature NEUROLOGIC: Oriented X3. Cranial nerves 2-12 grossly intact. Moves all extremities no tremor;generalized  weakness PSYCHIATRIC: Mood and affect appropriate to situation, no behavioral issues  Patient Active Problem List   Diagnosis Date Noted  . Chronic combined systolic and diastolic CHF (congestive heart failure) 07/08/2013  . Diarrhea 07/05/2013  . Acute on chronic kidney disease, stage 4 07/05/2013  . Vomiting 07/05/2013  . Generalized weakness 07/05/2013  . Dementia 10/27/2012  . Neuropathy 10/27/2012  . Chronic back pain 10/27/2012  . Hypoglycemia 10/26/2012  . Near syncope 10/26/2012  . Physical deconditioning 12/16/2011  . Obstructive sleep apnea 12/16/2011  . Diabetes mellitus 12/16/2011  . Hypertension 12/16/2011  . Dyslipidemia 12/16/2011  . Benign prostatic hypertrophy 12/16/2011  . Gastroesophageal reflux disease 12/16/2011  . Microcytic anemia 12/16/2011  . Community acquired pneumonia 12/15/2011  . Falls 12/15/2011  . Chronic respiratory failure 12/15/2011  . Obesity, morbid (more than 100 lbs over ideal weight or BMI > 40) 12/15/2011  . CKD (chronic kidney disease), stage IV 12/15/2011  . COPD (chronic obstructive pulmonary disease) with acute bronchitis 12/15/2011    CBC    Component Value Date/Time   WBC 8.9 07/06/2013 1105   RBC 4.02* 07/06/2013 1105   RBC 4.57 12/17/2011 0445   HGB 8.9* 07/06/2013 1105   HCT 28.1* 07/06/2013 1105   PLT 229 07/06/2013 1105   MCV 69.9* 07/06/2013 1105   LYMPHSABS 2.6 07/05/2013 1440   MONOABS 1.0 07/05/2013 1440   EOSABS 0.1 07/05/2013 1440   BASOSABS 0.0 07/05/2013 1440    CMP     Component Value Date/Time   NA 141 07/07/2013 0530   K 4.7 07/07/2013 0530   CL 108 07/07/2013 0530   CO2 26 07/07/2013 0530   GLUCOSE 73 07/07/2013 0530   BUN 51* 07/07/2013 0530   CREATININE 1.69* 07/07/2013 0530   CALCIUM 8.6 07/07/2013 0530   PROT 6.9 07/05/2013 1440   ALBUMIN 2.9* 07/05/2013 1440   AST 6 07/05/2013 1440   ALT 6 07/05/2013 1440   ALKPHOS 82 07/05/2013 1440   BILITOT 0.2* 07/05/2013 1440   GFRNONAA 38* 07/07/2013 0530   GFRAA 44*  07/07/2013 0530    Assessment and Plan  Acute on chronic kidney disease, stage 4 Due to diarrhea-baseline Cr appears to be around 2.0  Chronic combined systolic and diastolic CHF (congestive heart failure) EF 40-45% per Echo 09/2012; compensated;continue ace, lasix,imdur  Diabetes mellitus Last HbA1c was 2 years ago, 8.1;levimer, SSI and diabetic diet  Obesity, morbid (more than 100 lbs over ideal weight or BMI > 40) Diet, exercise, in this case PT/OT  Obstructive sleep apnea No mention of cpap; needs c pap  Physical deconditioning OT/PT  Dyslipidemia Continue zocor  Dementia Continue namenda  Neuropathy Continue neurontin  Gastroesophageal reflux disease Continue omeprazole    Margit Hanks, MD

## 2013-07-19 ENCOUNTER — Non-Acute Institutional Stay (SKILLED_NURSING_FACILITY): Payer: Medicare Other | Admitting: Internal Medicine

## 2013-07-19 ENCOUNTER — Encounter: Payer: Self-pay | Admitting: Internal Medicine

## 2013-07-19 DIAGNOSIS — Z7189 Other specified counseling: Secondary | ICD-10-CM

## 2013-07-19 DIAGNOSIS — I5042 Chronic combined systolic (congestive) and diastolic (congestive) heart failure: Secondary | ICD-10-CM

## 2013-07-19 DIAGNOSIS — G4733 Obstructive sleep apnea (adult) (pediatric): Secondary | ICD-10-CM

## 2013-07-19 DIAGNOSIS — I509 Heart failure, unspecified: Secondary | ICD-10-CM

## 2013-07-19 NOTE — Progress Notes (Signed)
MRN: [id number] Name: [name]  Sex: male Age: 72 y.o. DOB: [birth year]  PSC #: [name] Facility/Room: 102B Level Of Care: SNF Provider: Merrilee Seashore D Emergency Contacts: Extended Emergency Contact Information Primary Emergency Contact: Reich,Betty Address: [address]          [place], [place] [address] [place] Home Phone: [phone] Relation: Spouse  Code Status:   Allergies: Review of patient's allergies indicates no known allergies.  Chief Complaint  Patient presents with  . Medical Managment of Chronic Issues    HPI: Patient is 73 y.o. male whose wife asked to speak to me about several concerns  Past Medical History  Diagnosis Date  . Coronary artery disease   . Diabetes mellitus   . CHF (congestive heart failure)   . Stage III chronic kidney disease   . Arthritis   . COPD (chronic obstructive pulmonary disease)   . Chronic respiratory failure   . Obesity, Class III, BMI 40-49.9 (morbid obesity)   . Obstructive sleep apnea   . Gastroesophageal reflux   . Benign prostatic hypertrophy   . Hyperlipidemia   . Hypertension   . Microcytic anemia 12/16/2011  . CHF (congestive heart failure)     Past Surgical History  Procedure Laterality Date  . Back surgery    . Coronary artery bypass graft    . Cholecystectomy    . Tonsillectomy    . Cataract extraction        Medication List       This list is accurate as of: 07/19/13  8:57 PM.  Always use your most recent med list.               benazepril 20 MG tablet  Commonly known as:  LOTENSIN  Take 20 mg by mouth 2 (two) times daily.     dorzolamide-timolol 22.3-6.8 MG/ML ophthalmic solution  Commonly known as:  COSOPT  Place 1 drop into the left eye 2 (two) times daily.     FLUoxetine 20 MG tablet  Commonly known as:  PROZAC  Take 20 mg by mouth daily.     furosemide 20 MG tablet  Commonly known as:  LASIX  Take 1 tablet (20 mg total) by mouth daily.     gabapentin 600  MG tablet  Commonly known as:  NEURONTIN  Take 600 mg by mouth 4 (four) times daily.     insulin detemir 100 UNIT/ML injection  Commonly known as:  LEVEMIR  Inject 45 Units into the skin 2 (two) times daily.     insulin lispro 100 UNIT/ML injection  Commonly known as:  HUMALOG  Inject 0-20 Units into the skin 3 (three) times daily as needed. Home sliding scale     iron polysaccharides 150 MG capsule  Commonly known as:  NIFEREX  Take 1 capsule (150 mg total) by mouth daily.     isosorbide mononitrate 60 MG 24 hr tablet  Commonly known as:  IMDUR  Take 60 mg by mouth daily.     latanoprost 0.005 % ophthalmic solution  Commonly known as:  XALATAN  Place 1 drop into the left eye daily.     memantine 10 MG tablet  Commonly known as:  NAMENDA  Take 10 mg by mouth 2 (two) times daily.     omeprazole 40 MG capsule  Commonly known as:  PRILOSEC  Take 40 mg by mouth daily.     oxyCODONE-acetaminophen 10-325 MG per tablet  Commonly known as:  PERCOCET  Take 1 tablet by mouth 2 (two) times daily.     simvastatin 20 MG tablet  Commonly known as:  ZOCOR  Take 20 mg by mouth at bedtime.     SYSTANE 0.4-0.3 % Soln  Generic drug:  Polyethyl Glycol-Propyl Glycol  Place 1 drop into the left eye daily.     tamsulosin 0.4 MG Caps capsule  Commonly known as:  FLOMAX  Take 0.4 mg by mouth daily.     traMADol 50 MG tablet  Commonly known as:  ULTRAM  Take 1 tablet (50 mg total) by mouth every 6 (six) hours as needed.        No orders of the defined types were placed in this encounter.    Immunization History  Administered Date(s) Administered  . Influenza,inj,Quad PF,36+ Mos 07/06/2013    History  Substance Use Topics  . Smoking status: Former Smoker -- 1.00 packs/day for 46 years    Types: Cigarettes    Quit date: 09/02/2001  . Smokeless tobacco: Not on file  . Alcohol Use: No    Review of Systems  DATA OBTAINED: from patient, nurse, medical record, family  member GENERAL: Feels well no fevers, fatigue, appetite changes SKIN: No itching, rash HEENT: No complaint RESPIRATORY: No cough, wheezing,does get SOB with exertion CARDIAC: No chest pain, palpitations, lower extremity edema  GI: No abdominal pain, No N/V/D or constipation, No heartburn or reflux  GU: No dysuria, frequency or urgency, or incontinence  MUSCULOSKELETAL: No unrelieved bone/joint pain NEUROLOGIC: No headache, dizziness or focal weakness PSYCHIATRIC: No overt anxiety or sadness. Sleepy in the daytime, doesn't sleep well at night  Filed Vitals:   07/19/13 2035  BP: 142/87  Pulse: 76  Temp: 98.1 F (36.7 C)  Resp: 20    Physical Exam  GENERAL APPEARANCE: Alert, conversant. Appropriately groomed. No acute distress, very obese SKIN: No diaphoresis rash, or wounds HEENT:very large tongue RESPIRATORY: Breathing is even, unlabored. Lung sounds are clear, chronic O2 which pt takes off CARDIOVASCULAR: Heart RRR no murmurs, rubs or gallops. No peripheral edema  GASTROINTESTINAL: Abdomen is soft, non-tender, not distended w/ normal bowel sounds.  GENITOURINARY: Bladder non tender, not distended  MUSCULOSKELETAL: No abnormal joints or musculature NEUROLOGIC: Cranial nerves 2-12 grossly intact. Moves all extremities no tremor. PSYCHIATRIC: pt can start to fall asleep while one is talking to him, no behavioral issues  Patient Active Problem List   Diagnosis Date Noted  . Chronic combined systolic and diastolic CHF (congestive heart failure) 07/08/2013  . Diarrhea 07/05/2013  . Acute on chronic kidney disease, stage 4 07/05/2013  . Vomiting 07/05/2013  . Generalized weakness 07/05/2013  . Dementia 10/27/2012  . Neuropathy 10/27/2012  . Chronic back pain 10/27/2012  . Hypoglycemia 10/26/2012  . Near syncope 10/26/2012  . Physical deconditioning 12/16/2011  . Obstructive sleep apnea 12/16/2011  . Diabetes mellitus 12/16/2011  . Hypertension 12/16/2011  . Dyslipidemia  12/16/2011  . Benign prostatic hypertrophy 12/16/2011  . Gastroesophageal reflux disease 12/16/2011  . Microcytic anemia 12/16/2011  . Community acquired pneumonia 12/15/2011  . Falls 12/15/2011  . Chronic respiratory failure 12/15/2011  . Obesity, morbid (more than 100 lbs over ideal weight or BMI > 40) 12/15/2011  . CKD (chronic kidney disease), stage IV 12/15/2011  . COPD (chronic obstructive pulmonary disease) with acute bronchitis 12/15/2011    CBC    Component Value Date/Time   WBC 8.9 07/06/2013 1105   RBC 4.02* 07/06/2013 1105   RBC 4.57 12/17/2011 0445   HGB  8.9* 07/06/2013 1105   HCT 28.1* 07/06/2013 1105   PLT 229 07/06/2013 1105   MCV 69.9* 07/06/2013 1105   LYMPHSABS 2.6 07/05/2013 1440   MONOABS 1.0 07/05/2013 1440   EOSABS 0.1 07/05/2013 1440   BASOSABS 0.0 07/05/2013 1440    CMP     Component Value Date/Time   NA 141 07/07/2013 0530   K 4.7 07/07/2013 0530   CL 108 07/07/2013 0530   CO2 26 07/07/2013 0530   GLUCOSE 73 07/07/2013 0530   BUN 51* 07/07/2013 0530   CREATININE 1.69* 07/07/2013 0530   CALCIUM 8.6 07/07/2013 0530   PROT 6.9 07/05/2013 1440   ALBUMIN 2.9* 07/05/2013 1440   AST 6 07/05/2013 1440   ALT 6 07/05/2013 1440   ALKPHOS 82 07/05/2013 1440   BILITOT 0.2* 07/05/2013 1440   GFRNONAA 38* 07/07/2013 0530   GFRAA 44* 07/07/2013 0530    Assessment and Plan  FAMILY CONFERENCE WITH PT PRESENT- I spoke at length with pt's wife and godson;pt's wife was concerned about intermiddent confusion; pt hit his head 2 weeks ago with neg CT but could be post concussive syndrome but more likely is pt's untreated sleep apnea secondary to non compliance;discussed this at length along with weight issue and how sleep apnea relates to CHF and day time sleepiness and exercise tolerance;pt has agreed to wear C pap for 4 weeks; will order BMP to check Na, BUN,Cr. Pt's wife had several other concerns like pt getting food cut,pt getting shower,pt getting his diabetic shoes.  Time spent -  45 minutes  Margit Hanks, MD

## 2013-07-22 ENCOUNTER — Ambulatory Visit: Payer: Self-pay

## 2013-08-12 ENCOUNTER — Ambulatory Visit (INDEPENDENT_AMBULATORY_CARE_PROVIDER_SITE_OTHER): Payer: Medicare Other

## 2013-08-12 ENCOUNTER — Ambulatory Visit: Payer: Medicare Other

## 2013-08-12 VITALS — BP 154/86 | HR 64 | Resp 12

## 2013-08-12 DIAGNOSIS — E1149 Type 2 diabetes mellitus with other diabetic neurological complication: Secondary | ICD-10-CM

## 2013-08-12 DIAGNOSIS — Q828 Other specified congenital malformations of skin: Secondary | ICD-10-CM

## 2013-08-12 DIAGNOSIS — E1142 Type 2 diabetes mellitus with diabetic polyneuropathy: Secondary | ICD-10-CM

## 2013-08-12 DIAGNOSIS — E1140 Type 2 diabetes mellitus with diabetic neuropathy, unspecified: Secondary | ICD-10-CM

## 2013-08-12 DIAGNOSIS — M2040 Other hammer toe(s) (acquired), unspecified foot: Secondary | ICD-10-CM

## 2013-08-12 NOTE — Patient Instructions (Signed)
Diabetes and Foot Care Diabetes may cause you to have problems because of poor blood supply (circulation) to your feet and legs. This may cause the skin on your feet to become thinner, break easier, and heal more slowly. Your skin may become dry, and the skin may peel and crack. You may also have nerve damage in your legs and feet causing decreased feeling in them. You may not notice minor injuries to your feet that could lead to infections or more serious problems. Taking care of your feet is one of the most important things you can do for yourself.  HOME CARE INSTRUCTIONS  Wear shoes at all times, even in the house. Do not go barefoot. Bare feet are easily injured.  Check your feet daily for blisters, cuts, and redness. If you cannot see the bottom of your feet, use a mirror or ask someone for help.  Wash your feet with warm water (do not use hot water) and mild soap. Then pat your feet and the areas between your toes until they are completely dry. Do not soak your feet as this can dry your skin.  Apply a moisturizing lotion or petroleum jelly (that does not contain alcohol and is unscented) to the skin on your feet and to dry, brittle toenails. Do not apply lotion between your toes.  Trim your toenails straight across. Do not dig under them or around the cuticle. File the edges of your nails with an emery board or nail file.  Do not cut corns or calluses or try to remove them with medicine.  Wear clean socks or stockings every day. Make sure they are not too tight. Do not wear knee-high stockings since they may decrease blood flow to your legs.  Wear shoes that fit properly and have enough cushioning. To break in new shoes, wear them for just a few hours a day. This prevents you from injuring your feet. Always look in your shoes before you put them on to be sure there are no objects inside.  Do not cross your legs. This may decrease the blood flow to your feet.  If you find a minor scrape,  cut, or break in the skin on your feet, keep it and the skin around it clean and dry. These areas may be cleansed with mild soap and water. Do not cleanse the area with peroxide, alcohol, or iodine.  When you remove an adhesive bandage, be sure not to damage the skin around it.  If you have a wound, look at it several times a day to make sure it is healing.  Do not use heating pads or hot water bottles. They may burn your skin. If you have lost feeling in your feet or legs, you may not know it is happening until it is too late.  Make sure your health care provider performs a complete foot exam at least annually or more often if you have foot problems. Report any cuts, sores, or bruises to your health care provider immediately. SEEK MEDICAL CARE IF:   You have an injury that is not healing.  You have cuts or breaks in the skin.  You have an ingrown nail.  You notice redness on your legs or feet.  You feel burning or tingling in your legs or feet.  You have pain or cramps in your legs and feet.  Your legs or feet are numb.  Your feet always feel cold. SEEK IMMEDIATE MEDICAL CARE IF:   There is increasing redness,   swelling, or pain in or around a wound.  There is a red line that goes up your leg.  Pus is coming from a wound.  You develop a fever or as directed by your health care provider.  You notice a bad smell coming from an ulcer or wound. Document Released: 09/12/2000 Document Revised: 05/18/2013 Document Reviewed: 02/22/2013 Exit[employer] Patient Information 2014 [employer].  

## 2013-08-12 NOTE — Progress Notes (Signed)
  Subjective:    Patient ID: [name], male    DOB: 1941-09-21, 72 y.o.   MRN: [id number]  HPI Comments: DISPENSED DIABETIC SHOES AND GIVEN INSTRUCTION.  Diabetes   no changes medication her health history at this time    Review of Systems deferred at this visit     Objective:   Physical Exam Neurovascular status intact and unchanged although patient does have decreased absent epicritic sensation Semmes Weinstein testing pedal pulses diminished DP and PT pulse one over 4. Mild + edema noted patient minimally ambulatory in a wheelchair significant muscle weakness noted. History of multiple keratoses secondary digital contractures and hammertoe deformities. One pair of shoes and 3 pairs of dual density Plastizote inlays are dispensed. The the fit and contour well to the foot with full contact.       Assessment & Plan:  Diabetes with peripheral neuropathy. Digital deformities with keratoses and arthropathy and complicating factors. Dispensed 1 pair of shoes and 3 pairs of inlays at this time that fit and contour well to foot reappointed within the next 3-4 weeks for diabetic foot and palliative nail care contact us with any changes or exacerbations occur in the interim.  Alvan Dame DPM

## 2013-08-31 ENCOUNTER — Non-Acute Institutional Stay (SKILLED_NURSING_FACILITY): Payer: Medicare Other | Admitting: Internal Medicine

## 2013-08-31 ENCOUNTER — Encounter: Payer: Self-pay | Admitting: Internal Medicine

## 2013-08-31 DIAGNOSIS — R410 Disorientation, unspecified: Secondary | ICD-10-CM

## 2013-08-31 DIAGNOSIS — G4733 Obstructive sleep apnea (adult) (pediatric): Secondary | ICD-10-CM

## 2013-08-31 DIAGNOSIS — F0150 Vascular dementia without behavioral disturbance: Secondary | ICD-10-CM

## 2013-08-31 DIAGNOSIS — F05 Delirium due to known physiological condition: Secondary | ICD-10-CM

## 2013-08-31 NOTE — Progress Notes (Signed)
Patient ID: [name], male   DOB: 1941-06-08, 72 y.o.   MRN: [id number]  Location:  [facility] Provider:  Gwenith Spitz. Renato Gails, D.O., C.M.D.  Code Status:  Full code  Chief Complaint  Patient presents with  . Medical Managment of Chronic Issues    also remains delirious--visual and auditory hallucinations of a black dog in the room, talking about deceased relatives he is going to meet, sore area on top of right ear where O2 and eyeglasses rest    HPI:  72 yo male here for rehab after hospitalization for fall where he struck his head on the bathtub at home.  He has multiple comorbidities and was severely deconditioned and delirious with acute on chronic kidney disease and CHF during the stay.  His mental status has been waxing and waning since arrival here.  He had been on CPAP for his OSA at one time, but was not sent here with it b/c he was not adherent.  He was noted to be very confused and seeing a black dog across the room which he was talking to.  His roommate also told me that he has been hallucinating seeing other people in the room.  Pt's wife is also concerned about this.  He eats on his own when she is not here, but wants her to feed him when she is present.  Earlier today, he was up in his wheelchair coherent.  Last time he was seen by my colleague, he was also confused and she had a similar discussion with his wife.  Seems her memory is also poor.  She does recall that Dr. Donette Larry had said that her husband had some dementia even prior to his hospitalization.    Review of Systems:  Review of Systems  HENT: Negative for congestion.   Respiratory: Positive for shortness of breath. Negative for cough.   Cardiovascular: Positive for orthopnea and leg swelling. Negative for palpitations and PND.       Reproducible left shoulder and upper chest tenderness  Gastrointestinal: Negative for abdominal pain.  Genitourinary: Negative for dysuria.  Musculoskeletal: Positive for  falls and joint pain.  Neurological: Positive for weakness.  Psychiatric/Behavioral: Positive for hallucinations and memory loss.    Medications: Patient's Medications  New Prescriptions   No medications on file  Previous Medications   BENAZEPRIL (LOTENSIN) 20 MG TABLET    Take 20 mg by mouth 2 (two) times daily.   DORZOLAMIDE-TIMOLOL (COSOPT) 22.3-6.8 MG/ML OPHTHALMIC SOLUTION    Place 1 drop into the left eye 2 (two) times daily.   FLUOXETINE (PROZAC) 20 MG TABLET    Take 20 mg by mouth daily.   FUROSEMIDE (LASIX) 20 MG TABLET    Take 1 tablet (20 mg total) by mouth daily.   GABAPENTIN (NEURONTIN) 600 MG TABLET    Take 600 mg by mouth 4 (four) times daily.   INSULIN DETEMIR (LEVEMIR) 100 UNIT/ML INJECTION    Inject 45 Units into the skin 2 (two) times daily.   INSULIN LISPRO (HUMALOG) 100 UNIT/ML INJECTION    Inject 0-20 Units into the skin 3 (three) times daily as needed. Home sliding scale   IRON POLYSACCHARIDES (NIFEREX) 150 MG CAPSULE    Take 1 capsule (150 mg total) by mouth daily.   ISOSORBIDE MONONITRATE (IMDUR) 60 MG 24 HR TABLET    Take 60 mg by mouth daily.   LATANOPROST (XALATAN) 0.005 % OPHTHALMIC SOLUTION    Place 1 drop into the left eye daily.  MEMANTINE (NAMENDA) 10 MG TABLET    Take 10 mg by mouth 2 (two) times daily.   OMEPRAZOLE (PRILOSEC) 40 MG CAPSULE    Take 40 mg by mouth daily.   OXYCODONE-ACETAMINOPHEN (PERCOCET) 10-325 MG PER TABLET    Take 1 tablet by mouth 2 (two) times daily.   POLYETHYL GLYCOL-PROPYL GLYCOL (SYSTANE) 0.4-0.3 % SOLN    Place 1 drop into the left eye daily.    SIMVASTATIN (ZOCOR) 20 MG TABLET    Take 20 mg by mouth at bedtime.   TAMSULOSIN (FLOMAX) 0.4 MG CAPS CAPSULE    Take 0.4 mg by mouth daily.   TRAMADOL (ULTRAM) 50 MG TABLET    Take 1 tablet (50 mg total) by mouth every 6 (six) hours as needed.  Modified Medications   No medications on file  Discontinued Medications   No medications on file    Physical Exam: Filed Vitals:    08/31/13 1705  BP: 118/70  Pulse: 66  Temp: 98 F (36.7 C)  Resp: 20  SpO2: 99%  Physical Exam  Constitutional: No distress.  Obese black male in bed, wearing O2, HOB elevated  HENT:  Head: Normocephalic and atraumatic.  Cardiovascular: Normal rate, regular rhythm, normal heart sounds and intact distal pulses.   Tenderness of left upper chest wall and shoulder  Pulmonary/Chest: Effort normal and breath sounds normal. No respiratory distress.  Abdominal: Soft. Bowel sounds are normal. He exhibits no distension. There is no tenderness.  Musculoskeletal: He exhibits tenderness. He exhibits no edema.  Neurological: He is alert.  Disoriented to place and time, hallucinating  Skin: Skin is warm and dry.  Psychiatric: He is actively hallucinating. Cognition and memory are impaired. He expresses inappropriate judgment. He exhibits abnormal recent memory. He is inattentive.    Labs reviewed: Basic Metabolic Panel:  Recent Labs  16/10/96 1440 07/06/13 1105 07/07/13 0530  NA 137 138 141  K 5.0 4.6 4.7  CL 103 106 108  CO2 24 22 26   GLUCOSE 263* 284* 73  BUN 65* 55* 51*  CREATININE 2.41* 1.63* 1.69*  CALCIUM 8.7 8.7 8.6    Liver Function Tests:  Recent Labs  10/25/12 2256 07/05/13 1440  AST 9 6  ALT 10 6  ALKPHOS 82 82  BILITOT 0.2* 0.2*  PROT 6.9 6.9  ALBUMIN 3.0* 2.9*    CBC:  Recent Labs  10/25/12 2256  02/27/13 1850 07/05/13 1440 07/06/13 1105  WBC 6.5  < > 8.0 9.9 8.9  NEUTROABS 4.2  --   --  6.2  --   HGB 11.0*  < > 10.8* 9.0* 8.9*  HCT 34.9*  < > 35.3* 28.6* 28.1*  MCV 73.0*  < > 76.4* 69.6* 69.9*  PLT 166  < > 210 267 229  < > = values in this interval not displayed.  Reviewed CT brain from hospital with chronic microvascular ischemic changes  Assessment/Plan 1. Subacute delirium -mental state has been waxing and waning since admission -minimal improvement -was said to have had a concussion during fall, but CT w/o evidence of any  bleeding -does have some known baseline dementia, much less severe preadmission per his wife -last set of labs early November showed improved renal function and normal CO2 and Na levels -will recheck bmp today  2. Obstructive sleep apnea -no longer using his cpap and his wife did bring the one from home, but apparently it has a missing piece -nursing plans to find out if it is not functioning and try  to get a rental unit to replace it--I suspect he has some hypercapnea causing his delirium  3. Vascular dementia with delirium -he is on namenda, but not yet on aricept or exelon -doubt these will be of much benefit at this point -if renal function, Na and CO2 normal, would plan to check UA c+s next but he does not currently meet McGeer criteria for uti testing (no symptoms aside from confusion)   Family/ staff Communication:  HIs wife was present today during the visit, and I discussed his condition with her.  Goals of care: needs a care plan to reassess his code status and long term goals  Labs/tests ordered:  Bmp;  If normal, check UA c+s

## 2013-09-01 ENCOUNTER — Other Ambulatory Visit: Payer: Self-pay | Admitting: Internal Medicine

## 2013-09-01 DIAGNOSIS — R911 Solitary pulmonary nodule: Secondary | ICD-10-CM

## 2013-09-02 ENCOUNTER — Other Ambulatory Visit: Payer: Self-pay | Admitting: *Deleted

## 2013-09-02 ENCOUNTER — Non-Acute Institutional Stay (SKILLED_NURSING_FACILITY): Payer: Medicare Other | Admitting: Internal Medicine

## 2013-09-02 ENCOUNTER — Encounter: Payer: Self-pay | Admitting: Internal Medicine

## 2013-09-02 DIAGNOSIS — R5381 Other malaise: Secondary | ICD-10-CM

## 2013-09-02 DIAGNOSIS — R197 Diarrhea, unspecified: Secondary | ICD-10-CM

## 2013-09-02 DIAGNOSIS — N184 Chronic kidney disease, stage 4 (severe): Secondary | ICD-10-CM

## 2013-09-02 DIAGNOSIS — D509 Iron deficiency anemia, unspecified: Secondary | ICD-10-CM

## 2013-09-02 DIAGNOSIS — F0390 Unspecified dementia without behavioral disturbance: Secondary | ICD-10-CM

## 2013-09-02 MED ORDER — OXYCODONE-ACETAMINOPHEN 10-325 MG PO TABS: ORAL_TABLET | ORAL | Status: AC

## 2013-09-02 NOTE — Progress Notes (Signed)
MRN: [id number] Name: [name]  Sex: male Age: 72 y.o. DOB: 1941-03-07  PSC #: [name] Facility/Room: [address]B Level Of Care: SNF Provider: Merrilee Seashore D Emergency Contacts: Extended Emergency Contact Information Primary Emergency Contact: Messer,Betty Address: [address]          [place], [place] [address] [place] Home Phone: [phone] Relation: Spouse  Code Status:   Allergies: Review of patient's allergies indicates no known allergies.  Chief Complaint  Patient presents with  . family conference  . Medical Managment of Chronic Issues    HPI: Patient is 72 y.o. male whose wife would like to discuss several issues with me.   Past Medical History  Diagnosis Date  . Coronary artery disease   . Diabetes mellitus   . CHF (congestive heart failure)   . Stage III chronic kidney disease   . Arthritis   . COPD (chronic obstructive pulmonary disease)   . Chronic respiratory failure   . Obesity, Class III, BMI 40-49.9 (morbid obesity)   . Obstructive sleep apnea   . Gastroesophageal reflux   . Benign prostatic hypertrophy   . Hyperlipidemia   . Hypertension   . Microcytic anemia 12/16/2011  . CHF (congestive heart failure)     Past Surgical History  Procedure Laterality Date  . Back surgery    . Coronary artery bypass graft    . Cholecystectomy    . Tonsillectomy    . Cataract extraction        Medication List       This list is accurate as of: 09/02/13 11:59 PM.  Always use your most recent med list.               benazepril 20 MG tablet  Commonly known as:  LOTENSIN  Take 20 mg by mouth 2 (two) times daily.     dorzolamide-timolol 22.3-6.8 MG/ML ophthalmic solution  Commonly known as:  COSOPT  Place 1 drop into the left eye 2 (two) times daily.     FLUoxetine 20 MG tablet  Commonly known as:  PROZAC  Take 20 mg by mouth daily.     furosemide 20 MG tablet  Commonly known as:  LASIX  Take 1 tablet (20 mg total) by mouth  daily.     gabapentin 600 MG tablet  Commonly known as:  NEURONTIN  Take 600 mg by mouth 4 (four) times daily.     insulin detemir 100 UNIT/ML injection  Commonly known as:  LEVEMIR  Inject 45 Units into the skin 2 (two) times daily.     insulin lispro 100 UNIT/ML injection  Commonly known as:  HUMALOG  Inject 0-20 Units into the skin 3 (three) times daily as needed. Home sliding scale     iron polysaccharides 150 MG capsule  Commonly known as:  NIFEREX  Take 1 capsule (150 mg total) by mouth daily.     isosorbide mononitrate 60 MG 24 hr tablet  Commonly known as:  IMDUR  Take 60 mg by mouth daily.     latanoprost 0.005 % ophthalmic solution  Commonly known as:  XALATAN  Place 1 drop into the left eye daily.     memantine 10 MG tablet  Commonly known as:  NAMENDA  Take 10 mg by mouth 2 (two) times daily.     omeprazole 40 MG capsule  Commonly known as:  PRILOSEC  Take 40 mg by mouth daily.     oxyCODONE-acetaminophen 10-325 MG per tablet  Commonly  known as:  PERCOCET  Take one tablet by mouth twice daily     simvastatin 20 MG tablet  Commonly known as:  ZOCOR  Take 20 mg by mouth at bedtime.     SYSTANE 0.4-0.3 % Soln  Generic drug:  Polyethyl Glycol-Propyl Glycol  Place 1 drop into the left eye daily.     tamsulosin 0.4 MG Caps capsule  Commonly known as:  FLOMAX  Take 0.4 mg by mouth daily.     traMADol 50 MG tablet  Commonly known as:  ULTRAM  Take 1 tablet (50 mg total) by mouth every 6 (six) hours as needed.        No orders of the defined types were placed in this encounter.    Immunization History  Administered Date(s) Administered  . Influenza,inj,Quad PF,36+ Mos 07/06/2013    History  Substance Use Topics  . Smoking status: Former Smoker -- 1.00 packs/day for 46 years    Types: Cigarettes    Quit date: 09/02/2001  . Smokeless tobacco: Not on file  . Alcohol Use: No    Review of Systems  DATA OBTAINED: from patient, wife GENERAL:  Feels well no fevers, fatigue, appetite changes SKIN: No itching, rash HEENT: No complaint RESPIRATORY: No cough, wheezing, SOB CARDIAC: No chest pain, palpitations, lower extremity edema  GI: No abdominal pain, No N/V + chronic loose stool, No heartburn or reflux  GU: No dysuria, frequency or urgency, or incontinence  MUSCULOSKELETAL: No unrelieved bone/joint pain NEUROLOGIC: No headache, dizziness PSYCHIATRIC: No overt anxiety or sadness. Sleeps well.   Filed Vitals:   09/02/13 1602  BP: 116/74  Pulse: 42  Temp: 98.5 F (36.9 C)  Resp: 18    Physical Exam  GENERAL APPEARANCE: Alert, mod conversant. Appropriately groomed. No acute distress  SKIN: No diaphoresis rash HEENT: Unremarkable RESPIRATORY: Breathing is even, unlabored. Lung sounds are full with rhonchi, no wheezing  CARDIOVASCULAR: Heart RRR no murmurs, rubs or gallops. No peripheral edema  GASTROINTESTINAL: Abdomen is soft, non-tender, not distended w/ normal bowel sounds.  GENITOURINARY: Bladder non tender, not distended  MUSCULOSKELETAL: No abnormal joints or musculature NEUROLOGIC: Cranial nerves 2-12 grossly intact. Moves all extremities no tremor. PSYCHIATRIC: dementia, no behavioral issues  Patient Active Problem List   Diagnosis Date Noted  . Chronic combined systolic and diastolic CHF (congestive heart failure) 07/08/2013  . Diarrhea 07/05/2013  . Acute on chronic kidney disease, stage 4 07/05/2013  . Vomiting 07/05/2013  . Generalized weakness 07/05/2013  . Dementia 10/27/2012  . Neuropathy 10/27/2012  . Chronic back pain 10/27/2012  . Hypoglycemia 10/26/2012  . Near syncope 10/26/2012  . Physical deconditioning 12/16/2011  . Obstructive sleep apnea 12/16/2011  . Diabetes mellitus 12/16/2011  . Hypertension 12/16/2011  . Dyslipidemia 12/16/2011  . Benign prostatic hypertrophy 12/16/2011  . Gastroesophageal reflux disease 12/16/2011  . Microcytic anemia 12/16/2011  . Community acquired pneumonia  12/15/2011  . Falls 12/15/2011  . Chronic respiratory failure 12/15/2011  . Obesity, morbid (more than 100 lbs over ideal weight or BMI > 40) 12/15/2011  . CKD (chronic kidney disease), stage IV 12/15/2011  . COPD (chronic obstructive pulmonary disease) with acute bronchitis 12/15/2011    CBC  12/4  WBC 7.8  8.9/31.5  PLT 250    Component Value Date/Time   WBC 8.9 07/06/2013 1105   RBC 4.02* 07/06/2013 1105   RBC 4.57 12/17/2011 0445   HGB 8.9* 07/06/2013 1105   HCT 28.1* 07/06/2013 1105   PLT 229 07/06/2013 1105  MCV 69.9* 07/06/2013 1105   LYMPHSABS 2.6 07/05/2013 1440   MONOABS 1.0 07/05/2013 1440   EOSABS 0.1 07/05/2013 1440   BASOSABS 0.0 07/05/2013 1440    CMP   12/4  141, 5.1, 105, 35, 141, 48/1.87  Ca 9.0  GFR  43    Component Value Date/Time   NA 141 07/07/2013 0530   K 4.7 07/07/2013 0530   CL 108 07/07/2013 0530   CO2 26 07/07/2013 0530   GLUCOSE 73 07/07/2013 0530   BUN 51* 07/07/2013 0530   CREATININE 1.69* 07/07/2013 0530   CALCIUM 8.6 07/07/2013 0530   PROT 6.9 07/05/2013 1440   ALBUMIN 2.9* 07/05/2013 1440   AST 6 07/05/2013 1440   ALT 6 07/05/2013 1440   ALKPHOS 82 07/05/2013 1440   BILITOT 0.2* 07/05/2013 1440   GFRNONAA 38* 07/07/2013 0530   GFRAA 44* 07/07/2013 0530    Assessment and Plan  Pt's wife and I spoke of many things. I reviewed labs with here, specifically that pt's H/H was stable. Also we I addressed kidney fx, BUN being a little high but pt can drink fine and his Cr 1.87 is baseline range.  Wife was worried about the diarrhea which is chronic and she says if he could walk to the bathroom and use it without accidents he could come home. I told her I thought it was diabetic diarrhea but I would send him to GI if she wished. Pt has not recently been on antibiotics and he has had diarrhea in the past.  Wife was also worried about pt's " forgetfulness" . I had reviewed pt's CT scan with atrophy and microvascular dx and shared this with wife. His dementia was not  new. Then she told me that he had been in a SNF prior for 6 months before he could come home which wasn't known;  there is some denial here.   Margit Hanks, MD   Recent entry-since my visit a CXR and CT chest has come back with mass on the left. There is to be a family conference tomorrow

## 2013-09-05 ENCOUNTER — Ambulatory Visit
Admission: RE | Admit: 2013-09-05 | Discharge: 2013-09-05 | Disposition: A | Discharge status: Home / Self Care | Payer: Medicare Other | Source: Ambulatory Visit | Attending: Internal Medicine | Admitting: Internal Medicine

## 2013-09-05 ENCOUNTER — Other Ambulatory Visit: Payer: Self-pay | Admitting: Internal Medicine

## 2013-09-05 DIAGNOSIS — R911 Solitary pulmonary nodule: Secondary | ICD-10-CM

## 2013-09-06 ENCOUNTER — Encounter: Payer: Self-pay | Admitting: Internal Medicine

## 2013-09-07 ENCOUNTER — Other Ambulatory Visit: Payer: Self-pay | Admitting: Internal Medicine

## 2013-09-07 ENCOUNTER — Non-Acute Institutional Stay (SKILLED_NURSING_FACILITY): Payer: Medicare Other | Admitting: Internal Medicine

## 2013-09-07 ENCOUNTER — Encounter: Payer: Self-pay | Admitting: Internal Medicine

## 2013-09-07 DIAGNOSIS — G4733 Obstructive sleep apnea (adult) (pediatric): Secondary | ICD-10-CM

## 2013-09-07 DIAGNOSIS — F0150 Vascular dementia without behavioral disturbance: Secondary | ICD-10-CM | POA: Insufficient documentation

## 2013-09-07 DIAGNOSIS — I5042 Chronic combined systolic (congestive) and diastolic (congestive) heart failure: Secondary | ICD-10-CM

## 2013-09-07 DIAGNOSIS — R222 Localized swelling, mass and lump, trunk: Secondary | ICD-10-CM

## 2013-09-07 DIAGNOSIS — N184 Chronic kidney disease, stage 4 (severe): Secondary | ICD-10-CM

## 2013-09-07 DIAGNOSIS — R918 Other nonspecific abnormal finding of lung field: Secondary | ICD-10-CM

## 2013-09-07 DIAGNOSIS — Z7189 Other specified counseling: Secondary | ICD-10-CM

## 2013-09-07 DIAGNOSIS — F05 Delirium due to known physiological condition: Secondary | ICD-10-CM

## 2013-09-07 DIAGNOSIS — I509 Heart failure, unspecified: Secondary | ICD-10-CM

## 2013-09-07 NOTE — Progress Notes (Signed)
Patient ID: [name], male   DOB: Feb 11, 1941, 72 y.o.   MRN: [id number]  Location:  [facility] Provider:  Gwenith Spitz. Renato Gails, D.O., C.M.D.  Code Status:  Reviewed today with pt's wife, code status now DNR, comfort measures, palliative care   Chief Complaint  Patient presents with  . Acute Visit    meeting with Adaline Sill, pt's wife to discuss goals of care and review abnormal CT scan with left upper lobe mass   HPI:   72 yo male with h/o vascular dementia, OSA (meant to be on cpap) who was here initially for short term rehab s/p hospitalization with generalized weakness and gastroenteritis was seen today for ongoing delirium and new findings on CT chest obtained due to abnormal CXR.  He has had difficulty with left sided chest pains with associated tenderness, increased dyspnea and confusion.  He drifts off to sleep sporadically throughout the visit and generally thoughout the day.  His po intake is poor--he asks his wife to bring fish sandwiches, but doesn't eat them.  He spends most of the time in bed and requests to be in bed anytime staff get him up in the wheelchair.  He had a CT chest done with a left upper lobe spiculating mass concerning for bronchogenic carcinoma.    Review of Systems:  Review of Systems  Constitutional: Positive for malaise/fatigue.  HENT: Negative for congestion.   Eyes: Negative for blurred vision.  Respiratory: Positive for shortness of breath. Negative for cough, sputum production and wheezing.   Cardiovascular: Positive for chest pain and leg swelling. Negative for palpitations.  Gastrointestinal: Positive for diarrhea. Negative for nausea, vomiting and abdominal pain.       Dark stools--on iron  Genitourinary: Negative for dysuria, urgency, frequency and hematuria.  Musculoskeletal: Negative for joint pain and myalgias.  Skin: Negative for rash.  Neurological: Negative for loss of consciousness and headaches.       Drowsy, drifts off to  sleep easily, mental status waxes and wanes since admission  Psychiatric/Behavioral: Positive for depression and memory loss.    Medications: Patient's Medications  New Prescriptions   No medications on file  Previous Medications   BENAZEPRIL (LOTENSIN) 20 MG TABLET    Take 20 mg by mouth 2 (two) times daily.   DORZOLAMIDE-TIMOLOL (COSOPT) 22.3-6.8 MG/ML OPHTHALMIC SOLUTION    Place 1 drop into the left eye 2 (two) times daily.   FLUOXETINE (PROZAC) 20 MG TABLET    Take 20 mg by mouth daily.   FUROSEMIDE (LASIX) 20 MG TABLET    Take 1 tablet (20 mg total) by mouth daily.   GABAPENTIN (NEURONTIN) 600 MG TABLET    Take 600 mg by mouth 4 (four) times daily.   INSULIN DETEMIR (LEVEMIR) 100 UNIT/ML INJECTION    Inject 45 Units into the skin 2 (two) times daily.   INSULIN LISPRO (HUMALOG) 100 UNIT/ML INJECTION    Inject 5 Units into the skin 3 (three) times daily as needed. Home sliding scale   IRON POLYSACCHARIDES (NIFEREX) 150 MG CAPSULE    Take 1 capsule (150 mg total) by mouth daily.   ISOSORBIDE MONONITRATE (IMDUR) 60 MG 24 HR TABLET    Take 60 mg by mouth daily.   LATANOPROST (XALATAN) 0.005 % OPHTHALMIC SOLUTION    Place 1 drop into the left eye daily.   MEMANTINE (NAMENDA) 10 MG TABLET    Take 10 mg by mouth 2 (two) times daily.   OMEPRAZOLE (PRILOSEC) 40 MG  CAPSULE    Take 40 mg by mouth daily.   OXYCODONE-ACETAMINOPHEN (PERCOCET) 10-325 MG PER TABLET    Take one tablet by mouth twice daily   POLYETHYL GLYCOL-PROPYL GLYCOL (SYSTANE) 0.4-0.3 % SOLN    Place 1 drop into the left eye daily.    SIMVASTATIN (ZOCOR) 20 MG TABLET    Take 20 mg by mouth at bedtime.   TAMSULOSIN (FLOMAX) 0.4 MG CAPS CAPSULE    Take 0.4 mg by mouth daily.   TRAMADOL (ULTRAM) 50 MG TABLET    Take 1 tablet (50 mg total) by mouth every 6 (six) hours as needed.  Modified Medications   No medications on file  Discontinued Medications   No medications on file    Physical Exam: Filed Vitals:   09/07/13 1223  BP:  110/60  Pulse: 60  Temp: 97.7 F (36.5 C)  Resp: 18  SpO2: 96%  Physical Exam  Constitutional: He appears well-developed.  Obese black male, in bed, wearing O2, wakes up briefly and answers questions appropriately, then falls back to sleep  HENT:  Head: Normocephalic.  Eyes: Pupils are equal, round, and reactive to light.  Neck: Neck supple.  Cardiovascular: Normal rate, regular rhythm, normal heart sounds and intact distal pulses.   Pulmonary/Chest: Effort normal. He has wheezes. He exhibits tenderness.  Rhonchi also present, left upper chest is tender to touch  Abdominal: Soft. Bowel sounds are normal. He exhibits no distension. There is no tenderness.  Musculoskeletal: He exhibits no tenderness.  Skin: Skin is warm and dry.  Psychiatric: He is withdrawn and actively hallucinating. Cognition and memory are impaired. He exhibits abnormal recent memory. He is inattentive.    Labs reviewed: Basic Metabolic Panel:  Recent Labs  16/10/96 1440 07/06/13 1105 07/07/13 0530  NA 137 138 141  K 5.0 4.6 4.7  CL 103 106 108  CO2 24 22 26   GLUCOSE 263* 284* 73  BUN 65* 55* 51*  CREATININE 2.41* 1.63* 1.69*  CALCIUM 8.7 8.7 8.6    Liver Function Tests:  Recent Labs  10/25/12 2256 07/05/13 1440  AST 9 6  ALT 10 6  ALKPHOS 82 82  BILITOT 0.2* 0.2*  PROT 6.9 6.9  ALBUMIN 3.0* 2.9*    CBC:  Recent Labs  10/25/12 2256  02/27/13 1850 07/05/13 1440 07/06/13 1105  WBC 6.5  < > 8.0 9.9 8.9  NEUTROABS 4.2  --   --  6.2  --   HGB 11.0*  < > 10.8* 9.0* 8.9*  HCT 34.9*  < > 35.3* 28.6* 28.1*  MCV 73.0*  < > 76.4* 69.6* 69.9*  PLT 166  < > 210 267 229  < > = values in this interval not displayed.  Significant Diagnostic Results:  09/01/13:  Ct chest:  1. 5.3 x 4.2 cm left upper lobe mass with somewhat spiculated margins, increasing in size, concerning for bronchogenic carcinoma.  Adjacent mild AP window adenopathy a 1.2 cm. There are some adjacent bands of density  extending from this mass but it overall I doubt that this represents rounded atelectasis given the progressive nature. Biopsy or PET-CT recommended. 2. IV contrast was not administered due to renal insufficiency.  Assessment/Plan 1. Mass of lung -large mass LUL causing chest pain, dyspnea and waking him from sleep at night -cont bid percocet routinely, but added roxanol 5mg  po q4 hrs prn chest pain or dyspnea -palliative care consult ordered due to poor prognosis which was reviewed with his wife -check PET scan to  assess for metastatic disease -code status was readdressed--DNR now with comfort measures  2. Vascular dementia with delirium -delirium is not improving -suspect his cancer is the core etiology and poorly controlled osa -need PET to rule out brain mets  3. Obstructive sleep apnea -has been unable to use cpap due to a broken part and staff were to try to obtain a new machine for him  -needs this to help with mentation  4. CKD (chronic kidney disease), stage IV -renal function has remained stable  5. Chronic combined systolic and diastolic CHF (congestive heart failure) -has been stable, no overt signs of acute chf  6. Encounter for family conference with patient present Family/ staff Communication: care plan meeting was held with pt's wife, Kathie Rhodes, today.  Social work was also in attendance.  Discussed the findings of the CT chest and she says she knew that's what was wrong.  Apparently, her sister in law also had lung cancer.  She told me he has said he does not want to be brought back if he has a cardiac or respiratory arrest and does not want to be kept alive on machines.  She and her son both agree he would not tolerate a surgery at this point.  We discussed how rehospitalization may make his condition worse at this time as his delirium has never resolved.  He has expressed that he is tired, feels he has lived his life.  He has been experiencing pain in the middle of the night  in his left chest and has only scheduled percocet bid at this time.  We decided to go ahead with the PET whole body to assess for metastatic disease and go from there.  We did touch on the idea of hospice care for him.  He is currently medicaid pending and still on medicare days at the moment.  His wife agrees with comfort measures at this time and getting better control of his pain here at the facility.    Goals of care: DNR, comfort measures  Labs/tests ordered:  PET whole body  More than half of this 45 minute visit was spent counseling pt's wife re: goals of care and management of pt's left upper lobe mass suspicious for new lung cancer.

## 2013-09-16 ENCOUNTER — Ambulatory Visit: Payer: Medicare Other

## 2013-10-19 ENCOUNTER — Non-Acute Institutional Stay (SKILLED_NURSING_FACILITY): Payer: Medicare Other | Admitting: Internal Medicine

## 2013-10-19 DIAGNOSIS — I5042 Chronic combined systolic (congestive) and diastolic (congestive) heart failure: Secondary | ICD-10-CM

## 2013-10-19 DIAGNOSIS — I509 Heart failure, unspecified: Secondary | ICD-10-CM

## 2013-10-19 DIAGNOSIS — F0150 Vascular dementia without behavioral disturbance: Secondary | ICD-10-CM

## 2013-10-19 DIAGNOSIS — R222 Localized swelling, mass and lump, trunk: Secondary | ICD-10-CM

## 2013-10-19 DIAGNOSIS — N184 Chronic kidney disease, stage 4 (severe): Secondary | ICD-10-CM

## 2013-10-19 DIAGNOSIS — F0151 Vascular dementia with behavioral disturbance: Secondary | ICD-10-CM

## 2013-10-19 DIAGNOSIS — E119 Type 2 diabetes mellitus without complications: Secondary | ICD-10-CM

## 2013-10-19 DIAGNOSIS — R079 Chest pain, unspecified: Secondary | ICD-10-CM

## 2013-10-19 DIAGNOSIS — F05 Delirium due to known physiological condition: Secondary | ICD-10-CM

## 2013-10-19 DIAGNOSIS — R410 Disorientation, unspecified: Secondary | ICD-10-CM

## 2013-10-19 DIAGNOSIS — R918 Other nonspecific abnormal finding of lung field: Secondary | ICD-10-CM

## 2013-10-19 DIAGNOSIS — G8929 Other chronic pain: Secondary | ICD-10-CM

## 2013-10-19 NOTE — Progress Notes (Signed)
Patient ID: Donald NgLinton R Hickman, male   DOB: 12/06/1940, 73 y.o.   MRN: [id number]  Location:  [facility] Provider:  [name], D.O., C.M.D.  Code Status:  DNR, paliiative care  Chief Complaint  Patient presents with  . Medical Management of Chronic Issues    HPI:  73 yo black male with h/o recurrent lung cancer, cardiopulmonary arrest for which he received CPR, vascular dementia, hypertension, and chronic chest pain seen for medical mgt of his chronic diseases.  He is chronically confused.  He has visual hallucinations.  He admits to chest pain on the left side that is there most of the time, but is improved with his pain medicine.  Weight has declined from 310 to 282 lbs.  His wife feels that today he is doing better.  He is up in his wheelchair, but he has good and bad days.  He still loves to eat ice cream (butter pecan).  He remains on his rehab benefit.  Review of Systems: see hpi Review of Systems  Constitutional: Positive for weight loss and malaise/fatigue. Negative for fever.  HENT: Negative for congestion.   Respiratory: Positive for shortness of breath.        With minimal activity (moving in bed and being moved to wheelchair)  Cardiovascular: Positive for chest pain. Negative for palpitations.  Gastrointestinal: Negative for constipation.  Genitourinary: Negative for dysuria.  Musculoskeletal: Negative for falls and myalgias.  Skin: Negative for rash.  Neurological: Positive for weakness. Negative for dizziness and loss of consciousness.  Psychiatric/Behavioral: Positive for hallucinations and memory loss.    Medications: Patient's Medications  New Prescriptions   No medications on file  Previous Medications   BENAZEPRIL (LOTENSIN) 20 MG TABLET    Take 20 mg by mouth 2 (two) times daily.   DORZOLAMIDE-TIMOLOL (COSOPT) 22.3-6.8 MG/ML OPHTHALMIC SOLUTION    Place 1 drop into the left eye 2 (two) times daily.   FLUOXETINE (PROZAC) 20 MG TABLET    Take 20  mg by mouth daily.   FUROSEMIDE (LASIX) 20 MG TABLET    Take 1 tablet (20 mg total) by mouth daily.   GABAPENTIN (NEURONTIN) 600 MG TABLET    Take 600 mg by mouth 4 (four) times daily.   INSULIN DETEMIR (LEVEMIR) 100 UNIT/ML INJECTION    Inject 45 Units into the skin 2 (two) times daily.   INSULIN LISPRO (HUMALOG) 100 UNIT/ML INJECTION    Inject 5 Units into the skin 3 (three) times daily as needed. Home sliding scale   IRON POLYSACCHARIDES (NIFEREX) 150 MG CAPSULE    Take 1 capsule (150 mg total) by mouth daily.   ISOSORBIDE MONONITRATE (IMDUR) 60 MG 24 HR TABLET    Take 60 mg by mouth daily.   LATANOPROST (XALATAN) 0.005 % OPHTHALMIC SOLUTION    Place 1 drop into the left eye daily.   MEMANTINE (NAMENDA) 10 MG TABLET    Take 10 mg by mouth 2 (two) times daily.   OMEPRAZOLE (PRILOSEC) 40 MG CAPSULE    Take 40 mg by mouth daily.   OXYCODONE-ACETAMINOPHEN (PERCOCET) 10-325 MG PER TABLET    Take one tablet by mouth twice daily   POLYETHYL GLYCOL-PROPYL GLYCOL (SYSTANE) 0.4-0.3 % SOLN    Place 1 drop into the left eye daily.    SIMVASTATIN (ZOCOR) 20 MG TABLET    Take 20 mg by mouth at bedtime.   TAMSULOSIN (FLOMAX) 0.4 MG CAPS CAPSULE    Take 0.4 mg by mouth daily.  TRAMADOL (ULTRAM) 50 MG TABLET    Take 1 tablet (50 mg total) by mouth every 6 (six) hours as needed.  Modified Medications   No medications on file  Discontinued Medications   No medications on file    Physical Exam: Filed Vitals:   10/19/13 0735  BP: 121/72  Pulse: 64  Temp: 98.4 F (36.9 C)  Resp: 18  Height: 5\' 11"  (1.803 m)  Weight: 281 lb (127.461 kg)  SpO2: 98%   Physical Exam  Constitutional:  Obese black male sitting up in wheelchair eating ice cream with help from his wife  Cardiovascular: Normal rate, regular rhythm, normal heart sounds and intact distal pulses.   Pulmonary/Chest: Effort normal and breath sounds normal.  Abdominal: Soft. Bowel sounds are normal. He exhibits no distension and no mass. There  is no tenderness.  Musculoskeletal: Normal range of motion. He exhibits edema.  Neurological:  Very confused--oriented to person only, difficulty concentrating on task and answering questions appropriately     Labs reviewed: Basic Metabolic Panel:  Recent Labs  40/98/11 1440 07/06/13 1105 07/07/13 0530  NA 137 138 141  K 5.0 4.6 4.7  CL 103 106 108  CO2 24 22 26   GLUCOSE 263* 284* 73  BUN 65* 55* 51*  CREATININE 2.41* 1.63* 1.69*  CALCIUM 8.7 8.7 8.6    Liver Function Tests:  Recent Labs  07/05/13 1440  AST 6  ALT 6  ALKPHOS 82  BILITOT 0.2*  PROT 6.9  ALBUMIN 2.9*    CBC:  Recent Labs  07/05/13 1440 07/06/13 1105  WBC 9.9 8.9  NEUTROABS 6.2  --   HGB 9.0* 8.9*  HCT 28.6* 28.1*  MCV 69.6* 69.9*  PLT 267 229    Assessment/Plan 1. Mass of lung -felt to be recurrent lung cancer--pt's wife decided not to pursue the PET scan -he is now receiving palliative care services with comfort as the goal  2. Vascular dementia with delirium -continues to have hallucinations and delusions  -oriented only to person -requires assist with all ADLs -can d/c namenda when discuss with his wife  3. Chronic combined systolic and diastolic CHF (congestive heart failure) -stable at this time, has been losing weight due to his lung cancer and dementia  4. CKD (chronic kidney disease), stage IV -will not recheck these values aggressively due to his goals of care -hydrate as he tolerates  5. Diabetes mellitus -cont levemir with novolog for cbg over 150 at meals--monitor for hypoglycemia, cont ace, statin at this time, but can likely stop statin also if ok'd by his wife  6. Chronic chest pain -is due to his CPR he received and could also be referred from the lung mass as it has not improved in time -cont percocet for pain  Family/ staff Communication: wife present and discussed with her, discussed w/ nursing  Goals of care: palliative care, avoid hospitalizations,  comfort measures

## 2013-11-01 ENCOUNTER — Encounter: Payer: Self-pay | Admitting: Internal Medicine

## 2013-11-01 ENCOUNTER — Non-Acute Institutional Stay (SKILLED_NURSING_FACILITY): Payer: Medicare Other | Admitting: Internal Medicine

## 2013-11-01 DIAGNOSIS — R5381 Other malaise: Secondary | ICD-10-CM

## 2013-11-01 DIAGNOSIS — R918 Other nonspecific abnormal finding of lung field: Secondary | ICD-10-CM

## 2013-11-01 DIAGNOSIS — F0390 Unspecified dementia without behavioral disturbance: Secondary | ICD-10-CM

## 2013-11-01 DIAGNOSIS — I509 Heart failure, unspecified: Secondary | ICD-10-CM

## 2013-11-01 DIAGNOSIS — E119 Type 2 diabetes mellitus without complications: Secondary | ICD-10-CM

## 2013-11-01 DIAGNOSIS — J9610 Chronic respiratory failure, unspecified whether with hypoxia or hypercapnia: Secondary | ICD-10-CM

## 2013-11-01 DIAGNOSIS — I5042 Chronic combined systolic (congestive) and diastolic (congestive) heart failure: Secondary | ICD-10-CM

## 2013-11-01 DIAGNOSIS — R222 Localized swelling, mass and lump, trunk: Secondary | ICD-10-CM

## 2013-11-01 DIAGNOSIS — M549 Dorsalgia, unspecified: Secondary | ICD-10-CM

## 2013-11-01 DIAGNOSIS — G8929 Other chronic pain: Secondary | ICD-10-CM

## 2013-11-01 NOTE — Assessment & Plan Note (Signed)
No exacerbations;continue imdur, lasix ,ACE

## 2013-11-01 NOTE — Assessment & Plan Note (Addendum)
Have been working to have pt pain free and not sleeping too much but he says the only time he is out of pain is when he is asleep

## 2013-11-01 NOTE — Assessment & Plan Note (Signed)
Dementia is progressing; pt needs help for every ADL; wife sits with him daily; pt wants to go home but unlikely will be able to

## 2013-11-01 NOTE — Assessment & Plan Note (Signed)
Pt has lost some weight but certainly this decreases mobility and ability to recover

## 2013-11-01 NOTE — Assessment & Plan Note (Addendum)
Fair control;pt on insulin, ACE and statin

## 2013-11-01 NOTE — Assessment & Plan Note (Signed)
Pt has been doing well, not requiring O2

## 2013-11-01 NOTE — Assessment & Plan Note (Signed)
Seen on CXR, explored with CT as a prognostic endeavor; most certainly CA and pallative cae only

## 2013-11-01 NOTE — Progress Notes (Signed)
MRN: [id number] Name: [name]  Sex: male Age: 73 y.o. DOB: 1941/07/27  [facility] Facility/Room: 102B Level Of Care: SNF Provider: Merrilee SeashoreALEXANDER, Effie Janoski D Emergency Contacts: Extended Emergency Contact Information Primary Emergency Contact: Ramsburg,Betty Address: [address]          [place], [place] [address] [place] Home Phone: [phone] Relation: Spouse  Code Status:   Allergies: Review of patient's allergies indicates no known allergies.  Chief Complaint  Patient presents with  . Medical Managment of Chronic Issues    HPI: Patient is 73 y.o. male who is being seen for routine medical problems.  Past Medical History  Diagnosis Date  . Coronary artery disease   . Diabetes mellitus   . CHF (congestive heart failure)   . Stage III chronic kidney disease   . Arthritis   . COPD (chronic obstructive pulmonary disease)   . Chronic respiratory failure   . Obesity, Class III, BMI 40-49.9 (morbid obesity)   . Obstructive sleep apnea   . Gastroesophageal reflux   . Benign prostatic hypertrophy   . Hyperlipidemia   . Hypertension   . Microcytic anemia 12/16/2011  . CHF (congestive heart failure)     Past Surgical History  Procedure Laterality Date  . Back surgery    . Coronary artery bypass graft    . Cholecystectomy    . Tonsillectomy    . Cataract extraction        Medication List       This list is accurate as of: 11/01/13 11:07 PM.  Always use your most recent med list.               benazepril 20 MG tablet  Commonly known as:  LOTENSIN  Take 20 mg by mouth 2 (two) times daily.     dorzolamide-timolol 22.3-6.8 MG/ML ophthalmic solution  Commonly known as:  COSOPT  Place 1 drop into the left eye 2 (two) times daily.     FLUoxetine 20 MG tablet  Commonly known as:  PROZAC  Take 20 mg by mouth daily.     furosemide 20 MG tablet  Commonly known as:  LASIX  Take 1 tablet (20 mg total) by mouth daily.     gabapentin 600 MG  tablet  Commonly known as:  NEURONTIN  Take 600 mg by mouth 4 (four) times daily.     insulin detemir 100 UNIT/ML injection  Commonly known as:  LEVEMIR  Inject 45 Units into the skin 2 (two) times daily.     insulin lispro 100 UNIT/ML injection  Commonly known as:  HUMALOG  Inject 5 Units into the skin 3 (three) times daily as needed. Home sliding scale     iron polysaccharides 150 MG capsule  Commonly known as:  NIFEREX  Take 1 capsule (150 mg total) by mouth daily.     isosorbide mononitrate 60 MG 24 hr tablet  Commonly known as:  IMDUR  Take 60 mg by mouth daily.     latanoprost 0.005 % ophthalmic solution  Commonly known as:  XALATAN  Place 1 drop into the left eye daily.     memantine 10 MG tablet  Commonly known as:  NAMENDA  Take 10 mg by mouth 2 (two) times daily.     omeprazole 40 MG capsule  Commonly known as:  PRILOSEC  Take 40 mg by mouth daily.     oxyCODONE-acetaminophen 10-325 MG per tablet  Commonly known as:  PERCOCET  Take one  tablet by mouth twice daily     simvastatin 20 MG tablet  Commonly known as:  ZOCOR  Take 20 mg by mouth at bedtime.     SYSTANE 0.4-0.3 % Soln  Generic drug:  Polyethyl Glycol-Propyl Glycol  Place 1 drop into the left eye daily.     tamsulosin 0.4 MG Caps capsule  Commonly known as:  FLOMAX  Take 0.4 mg by mouth daily.     traMADol 50 MG tablet  Commonly known as:  ULTRAM  Take 1 tablet (50 mg total) by mouth every 6 (six) hours as needed.        No orders of the defined types were placed in this encounter.    Immunization History  Administered Date(s) Administered  . Influenza,inj,Quad PF,36+ Mos 07/06/2013  . Pneumococcal-Unspecified 07/20/2013    History  Substance Use Topics  . Smoking status: Former Smoker -- 1.00 packs/day for 46 years    Types: Cigarettes    Quit date: 09/02/2001  . Smokeless tobacco: Not on file  . Alcohol Use: No    Review of Systems  DATA OBTAINED: from patient and wife; we  discussed pain management  GENERAL: Feels well no fevers, fatigue, appetite changes SKIN: No itching, rash HEENT: No complaint RESPIRATORY: No cough, wheezing, SOB CARDIAC: No chest pain, palpitations, lower extremity edema  GI: No abdominal pain, No N/V/D or constipation, No heartburn or reflux  GU: No dysuria, frequency or urgency, or incontinence  MUSCULOSKELETAL: B knee pain with weight bearing;woiuld like to get knees injected NEUROLOGIC: No headache, dizziness or focal weakness PSYCHIATRIC: No overt anxiety or sadness. Sleeps well.   Filed Vitals:   11/01/13 2245  BP: 140/61  Pulse: 69  Temp: 97.8 F (36.6 C)  Resp: 18    Physical Exam  GENERAL APPEARANCE: Alert, mod conversant. Appropriately groomed. No acute distress ,seems very comfortable, no pain indicators SKIN: No diaphoresis rash HEENT: Unremarkable RESPIRATORY: Breathing is even, unlabored. Lung sounds are clear   CARDIOVASCULAR: Heart RRR no murmurs, rubs or gallops. No peripheral edema  GASTROINTESTINAL: Abdomen is soft, non-tender, not distended w/ normal bowel sounds.  GENITOURINARY: Bladder non tender, not distended  MUSCULOSKELETAL: No abnormal joints or musculature NEUROLOGIC: Cranial nerves 2-12 grossly intact. Moves all extremities no tremor. PSYCHIATRIC: Mood and affect appropriate to situation, no behavioral issues  Patient Active Problem List   Diagnosis Date Noted  . Mass of lung 09/07/2013  . Vascular dementia with delirium 09/07/2013  . Chronic combined systolic and diastolic CHF (congestive heart failure) 07/08/2013  . Diarrhea 07/05/2013  . Acute on chronic kidney disease, stage 4 07/05/2013  . Vomiting 07/05/2013  . Generalized weakness 07/05/2013  . Dementia without behavioral disturbance 10/27/2012  . Neuropathy 10/27/2012  . Chronic back pain 10/27/2012  . Hypoglycemia 10/26/2012  . Near syncope 10/26/2012  . Physical deconditioning 12/16/2011  . Obstructive sleep apnea 12/16/2011   . Diabetes mellitus 12/16/2011  . Hypertension 12/16/2011  . Dyslipidemia 12/16/2011  . Benign prostatic hypertrophy 12/16/2011  . Gastroesophageal reflux disease 12/16/2011  . Microcytic anemia 12/16/2011  . Community acquired pneumonia 12/15/2011  . Falls 12/15/2011  . Chronic respiratory failure 12/15/2011  . Obesity, morbid (more than 100 lbs over ideal weight or BMI > 40) 12/15/2011  . CKD (chronic kidney disease), stage IV 12/15/2011  . COPD (chronic obstructive pulmonary disease) with acute bronchitis 12/15/2011    CBC    Component Value Date/Time   WBC 8.9 07/06/2013 1105   RBC 4.02* 07/06/2013 1105  RBC 4.57 12/17/2011 0445   HGB 8.9* 07/06/2013 1105   HCT 28.1* 07/06/2013 1105   PLT 229 07/06/2013 1105   MCV 69.9* 07/06/2013 1105   LYMPHSABS 2.6 07/05/2013 1440   MONOABS 1.0 07/05/2013 1440   EOSABS 0.1 07/05/2013 1440   BASOSABS 0.0 07/05/2013 1440    CMP     Component Value Date/Time   NA 141 07/07/2013 0530   K 4.7 07/07/2013 0530   CL 108 07/07/2013 0530   CO2 26 07/07/2013 0530   GLUCOSE 73 07/07/2013 0530   BUN 51* 07/07/2013 0530   CREATININE 1.69* 07/07/2013 0530   CALCIUM 8.6 07/07/2013 0530   PROT 6.9 07/05/2013 1440   ALBUMIN 2.9* 07/05/2013 1440   AST 6 07/05/2013 1440   ALT 6 07/05/2013 1440   ALKPHOS 82 07/05/2013 1440   BILITOT 0.2* 07/05/2013 1440   GFRNONAA 38* 07/07/2013 0530   GFRAA 44* 07/07/2013 0530    Assessment and Plan  Dementia without behavioral disturbance Dementia is progressing; pt needs help for every ADL; wife sits with him daily; pt wants to go home but unlikely will be able to  Mass of lung Seen on CXR, explored with CT as a prognostic endeavor; most certainly CA and pallative cae only  Obesity, morbid (more than 100 lbs over ideal weight or BMI > 40) Pt has lost some weight but certainly this decreases mobility and ability to recover  Physical deconditioning Unlikely to change much;pt is c/o knee pain and gets his knees injected  periodically; if it gives pt hope I'm for it;wife to set up appt  Diabetes mellitus Fair control;pt on insulin, ACE and statin  Chronic respiratory failure Pt has been doing well, not requiring O2  Chronic combined systolic and diastolic CHF (congestive heart failure) No exacerbations;continue imdur, lasix ,ACE  Chronic back pain Have been working to have pt pain free and not sleeping too much but he says the only time he is out of pai is when he is asleep    Margit Hanks, MD

## 2013-11-01 NOTE — Assessment & Plan Note (Signed)
Unlikely to change much;pt is c/o knee pain and gets his knees injected periodically; if it gives pt hope I'm for it;wife to set up appt

## 2013-12-06 ENCOUNTER — Encounter: Payer: Self-pay | Admitting: Internal Medicine

## 2013-12-06 ENCOUNTER — Non-Acute Institutional Stay (SKILLED_NURSING_FACILITY): Payer: Medicare Other | Admitting: Internal Medicine

## 2013-12-06 DIAGNOSIS — R222 Localized swelling, mass and lump, trunk: Secondary | ICD-10-CM

## 2013-12-06 DIAGNOSIS — R918 Other nonspecific abnormal finding of lung field: Secondary | ICD-10-CM

## 2013-12-11 NOTE — Progress Notes (Signed)
MRN: [id number] Name: [name]  Sex: male Age: 73 y.o. DOB: 16-Oct-1940  [facility] Facility/Room:   Level Of Care: SNF Provider: Merrilee SeashoreALEXANDER, Leilanee Righetti D Emergency Contacts: Extended Emergency Contact Information Primary Emergency Contact: Etcheverry,Betty Address: [address]          [place], [place] [id number] [place] Home Phone: [phone] Relation: Spouse   Allergies: Review of patient's allergies indicates no known allergies.  Chief Complaint  Patient presents with  . Medical Managment of Chronic Issues    HPI: Patient is 73 y.o. male who has a Left lung mass, most certainly cancer who has opted with his family for no further work-up who is c/o that his current pain regimen is not holding him.  Past Medical History  Diagnosis Date  . Coronary artery disease   . Diabetes mellitus   . CHF (congestive heart failure)   . Stage III chronic kidney disease   . Arthritis   . COPD (chronic obstructive pulmonary disease)   . Chronic respiratory failure   . Obesity, Class III, BMI 40-49.9 (morbid obesity)   . Obstructive sleep apnea   . Gastroesophageal reflux   . Benign prostatic hypertrophy   . Hyperlipidemia   . Hypertension   . Microcytic anemia 12/16/2011  . CHF (congestive heart failure)     Past Surgical History  Procedure Laterality Date  . Back surgery    . Coronary artery bypass graft    . Cholecystectomy    . Tonsillectomy    . Cataract extraction        Medication List       This list is accurate as of: 12/06/13 11:59 PM.  Always use your most recent med list.               benazepril 20 MG tablet  Commonly known as:  LOTENSIN  Take 20 mg by mouth 2 (two) times daily.     dorzolamide-timolol 22.3-6.8 MG/ML ophthalmic solution  Commonly known as:  COSOPT  Place 1 drop into the left eye 2 (two) times daily.     FLUoxetine 20 MG tablet  Commonly known as:  PROZAC  Take 20 mg by mouth daily.     furosemide 20 MG tablet   Commonly known as:  LASIX  Take 1 tablet (20 mg total) by mouth daily.     gabapentin 600 MG tablet  Commonly known as:  NEURONTIN  Take 600 mg by mouth 4 (four) times daily.     insulin detemir 100 UNIT/ML injection  Commonly known as:  LEVEMIR  Inject 45 Units into the skin 2 (two) times daily.     insulin lispro 100 UNIT/ML injection  Commonly known as:  HUMALOG  Inject 5 Units into the skin 3 (three) times daily as needed. Home sliding scale     iron polysaccharides 150 MG capsule  Commonly known as:  NIFEREX  Take 1 capsule (150 mg total) by mouth daily.     isosorbide mononitrate 60 MG 24 hr tablet  Commonly known as:  IMDUR  Take 60 mg by mouth daily.     latanoprost 0.005 % ophthalmic solution  Commonly known as:  XALATAN  Place 1 drop into the left eye daily.     memantine 10 MG tablet  Commonly known as:  NAMENDA  Take 10 mg by mouth 2 (two) times daily.     omeprazole 40 MG capsule  Commonly known as:  PRILOSEC  Take 40 mg  by mouth daily.     oxyCODONE-acetaminophen 10-325 MG per tablet  Commonly known as:  PERCOCET  Take one tablet by mouth twice daily     simvastatin 20 MG tablet  Commonly known as:  ZOCOR  Take 20 mg by mouth at bedtime.     SYSTANE 0.4-0.3 % Soln  Generic drug:  Polyethyl Glycol-Propyl Glycol  Place 1 drop into the left eye daily.     tamsulosin 0.4 MG Caps capsule  Commonly known as:  FLOMAX  Take 0.4 mg by mouth daily.     traMADol 50 MG tablet  Commonly known as:  ULTRAM  Take 1 tablet (50 mg total) by mouth every 6 (six) hours as needed.        No orders of the defined types were placed in this encounter.    Immunization History  Administered Date(s) Administered  . Influenza,inj,Quad PF,36+ Mos 07/06/2013  . Pneumococcal-Unspecified 07/20/2013    History  Substance Use Topics  . Smoking status: Former Smoker -- 1.00 packs/day for 46 years    Types: Cigarettes    Quit date: 09/02/2001  . Smokeless tobacco:  Not on file  . Alcohol Use: No    Review of Systems  DATA OBTAINED: from patient,wife GENERAL: no fevers, fatigue, appetite changes SKIN: No itching, rash HEENT: No complaint RESPIRATORY: No cough, wheezing, SOB CARDIAC: + chest pain, no palpitations, lower extremity edema  GI: No abdominal pain, No N/V/D or constipation, No heartburn or reflux  GU: No dysuria, frequency or urgency, or incontinence  MUSCULOSKELETAL: No unrelieved bone/joint pain NEUROLOGIC: No headache, dizziness or focal weakness PSYCHIATRIC: No overt anxiety or sadness. Sleeps well.   Filed Vitals:   12/06/13 2239  BP: 140/76  Pulse: 76  Temp: 98.2 F (36.8 C)  Resp: 18    Physical Exam  GENERAL APPEARANCE: Alert, conversant. Appropriately groomed. No acute distress  SKIN: No diaphoresis rash HEENT: Unremarkable RESPIRATORY: Breathing is even, unlabored. Lung sounds are slt dec on L  CARDIOVASCULAR: Heart RRR no murmurs, rubs or gallops. No peripheral edema  GASTROINTESTINAL: Abdomen is soft, non-tender, not distended w/ normal bowel sounds.  GENITOURINARY: Bladder non tender, not distended  MUSCULOSKELETAL: No abnormal joints or musculature NEUROLOGIC: Cranial nerves 2-12 grossly intact. Moves all extremities no tremor. PSYCHIATRIC: Mood and affect appropriate to situation, no behavioral issues  Patient Active Problem List   Diagnosis Date Noted  . Mass of lung 09/07/2013  . Vascular dementia with delirium 09/07/2013  . Chronic combined systolic and diastolic CHF (congestive heart failure) 07/08/2013  . Diarrhea 07/05/2013  . Acute on chronic kidney disease, stage 4 07/05/2013  . Vomiting 07/05/2013  . Generalized weakness 07/05/2013  . Dementia without behavioral disturbance 10/27/2012  . Neuropathy 10/27/2012  . Chronic back pain 10/27/2012  . Hypoglycemia 10/26/2012  . Near syncope 10/26/2012  . Physical deconditioning 12/16/2011  . Obstructive sleep apnea 12/16/2011  . Diabetes mellitus  12/16/2011  . Hypertension 12/16/2011  . Dyslipidemia 12/16/2011  . Benign prostatic hypertrophy 12/16/2011  . Gastroesophageal reflux disease 12/16/2011  . Microcytic anemia 12/16/2011  . Community acquired pneumonia 12/15/2011  . Falls 12/15/2011  . Chronic respiratory failure 12/15/2011  . Obesity, morbid (more than 100 lbs over ideal weight or BMI > 40) 12/15/2011  . CKD (chronic kidney disease), stage IV 12/15/2011  . COPD (chronic obstructive pulmonary disease) with acute bronchitis 12/15/2011    CBC    Component Value Date/Time   WBC 8.9 07/06/2013 1105   RBC 4.02* 07/06/2013  1105   RBC 4.57 12/17/2011 0445   HGB 8.9* 07/06/2013 1105   HCT 28.1* 07/06/2013 1105   PLT 229 07/06/2013 1105   MCV 69.9* 07/06/2013 1105   LYMPHSABS 2.6 07/05/2013 1440   MONOABS 1.0 07/05/2013 1440   EOSABS 0.1 07/05/2013 1440   BASOSABS 0.0 07/05/2013 1440    CMP     Component Value Date/Time   NA 141 07/07/2013 0530   K 4.7 07/07/2013 0530   CL 108 07/07/2013 0530   CO2 26 07/07/2013 0530   GLUCOSE 73 07/07/2013 0530   BUN 51* 07/07/2013 0530   CREATININE 1.69* 07/07/2013 0530   CALCIUM 8.6 07/07/2013 0530   PROT 6.9 07/05/2013 1440   ALBUMIN 2.9* 07/05/2013 1440   AST 6 07/05/2013 1440   ALT 6 07/05/2013 1440   ALKPHOS 82 07/05/2013 1440   BILITOT 0.2* 07/05/2013 1440   GFRNONAA 38* 07/07/2013 0530   GFRAA 44* 07/07/2013 0530    Assessment and Plan  Mass of lung Pt's wife, who is here every day, says the pain medication is not holding him and the pateint says he is hurting in his chest.LUL spiculating mass. Will increase his prn percocet 5mg  q4 to 7.5 mg q4.    Margit Hanks, MD

## 2013-12-11 NOTE — Assessment & Plan Note (Signed)
Pt's wife, who is here every day, says the pain medication is not holding him and the pateint says he is hurting in his chest.LUL spiculating mass. Will increase his prn percocet 5mg  q4 to 7.5 mg q4.

## 2013-12-22 ENCOUNTER — Telehealth: Payer: Self-pay | Admitting: Internal Medicine

## 2013-12-22 NOTE — Telephone Encounter (Signed)
Received a message from palliative care NP, Tommy.  Pt's wife now feels ready to proceed with hospice care for her husband.  An order is needed.  This was written on a script pad to [facility] to his lung cancer.

## 2014-02-15 ENCOUNTER — Encounter: Payer: Self-pay | Admitting: Internal Medicine

## 2014-02-15 ENCOUNTER — Non-Acute Institutional Stay (SKILLED_NURSING_FACILITY): Payer: Medicare Other | Admitting: Internal Medicine

## 2014-02-15 DIAGNOSIS — R410 Disorientation, unspecified: Secondary | ICD-10-CM

## 2014-02-15 DIAGNOSIS — F05 Delirium due to known physiological condition: Secondary | ICD-10-CM

## 2014-02-15 DIAGNOSIS — N184 Chronic kidney disease, stage 4 (severe): Secondary | ICD-10-CM

## 2014-02-15 DIAGNOSIS — J9610 Chronic respiratory failure, unspecified whether with hypoxia or hypercapnia: Secondary | ICD-10-CM

## 2014-02-15 DIAGNOSIS — F0390 Unspecified dementia without behavioral disturbance: Secondary | ICD-10-CM

## 2014-02-15 DIAGNOSIS — F0150 Vascular dementia without behavioral disturbance: Secondary | ICD-10-CM

## 2014-02-15 DIAGNOSIS — R918 Other nonspecific abnormal finding of lung field: Secondary | ICD-10-CM

## 2014-02-15 DIAGNOSIS — R0902 Hypoxemia: Secondary | ICD-10-CM

## 2014-02-15 DIAGNOSIS — F0151 Vascular dementia with behavioral disturbance: Secondary | ICD-10-CM

## 2014-02-15 DIAGNOSIS — R222 Localized swelling, mass and lump, trunk: Secondary | ICD-10-CM

## 2014-02-15 DIAGNOSIS — J9611 Chronic respiratory failure with hypoxia: Secondary | ICD-10-CM
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
## 2014-03-06 ENCOUNTER — Encounter: Payer: Self-pay | Admitting: Internal Medicine

## 2014-03-29 DEATH — deceased

## 2014-05-15 ENCOUNTER — Encounter: Payer: Self-pay | Admitting: Internal Medicine

## 2014-05-16 ENCOUNTER — Encounter: Payer: Self-pay | Admitting: Internal Medicine
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
